# Patient Record
Sex: Female | Born: 1955 | Race: White | Hispanic: No | Marital: Married | Smoking: Never smoker
Health system: Southern US, Community
[De-identification: ages and names within clinical notes are randomized; demographics above are authoritative.]

## PROBLEM LIST (undated history)

## (undated) DIAGNOSIS — I499 Cardiac arrhythmia, unspecified: Secondary | ICD-10-CM

## (undated) DIAGNOSIS — M1990 Unspecified osteoarthritis, unspecified site: Secondary | ICD-10-CM

## (undated) DIAGNOSIS — E7800 Pure hypercholesterolemia, unspecified: Secondary | ICD-10-CM

## (undated) DIAGNOSIS — I10 Essential (primary) hypertension: Secondary | ICD-10-CM

## (undated) DIAGNOSIS — T8859XA Other complications of anesthesia, initial encounter: Secondary | ICD-10-CM

## (undated) DIAGNOSIS — Z8489 Family history of other specified conditions: Secondary | ICD-10-CM

## (undated) DIAGNOSIS — Z9889 Other specified postprocedural states: Secondary | ICD-10-CM

## (undated) DIAGNOSIS — K257 Chronic gastric ulcer without hemorrhage or perforation: Secondary | ICD-10-CM

## (undated) DIAGNOSIS — F419 Anxiety disorder, unspecified: Secondary | ICD-10-CM

## (undated) DIAGNOSIS — R112 Nausea with vomiting, unspecified: Secondary | ICD-10-CM

## (undated) DIAGNOSIS — E538 Deficiency of other specified B group vitamins: Secondary | ICD-10-CM

## (undated) DIAGNOSIS — Z8719 Personal history of other diseases of the digestive system: Secondary | ICD-10-CM

## (undated) DIAGNOSIS — K219 Gastro-esophageal reflux disease without esophagitis: Secondary | ICD-10-CM

## (undated) DIAGNOSIS — C44612 Basal cell carcinoma of skin of right upper limb, including shoulder: Secondary | ICD-10-CM

## (undated) DIAGNOSIS — C4442 Squamous cell carcinoma of skin of scalp and neck: Secondary | ICD-10-CM

## (undated) DIAGNOSIS — Z9109 Other allergy status, other than to drugs and biological substances: Secondary | ICD-10-CM

## (undated) DIAGNOSIS — T4145XA Adverse effect of unspecified anesthetic, initial encounter: Secondary | ICD-10-CM

## (undated) HISTORY — PX: JOINT REPLACEMENT: SHX530

## (undated) HISTORY — PX: COLONOSCOPY: SHX174

---

## 2002-04-22 HISTORY — PX: ABDOMINAL HYSTERECTOMY: SHX81

## 2010-04-22 HISTORY — PX: PLANTAR FASCIA RELEASE: SHX2239

## 2012-04-22 HISTORY — PX: CARPAL TUNNEL RELEASE: SHX101

## 2013-04-22 HISTORY — PX: MOHS SURGERY: SUR867

## 2015-03-23 HISTORY — PX: SHOULDER ARTHROSCOPY W/ ROTATOR CUFF REPAIR: SHX2400

## 2015-10-26 ENCOUNTER — Other Ambulatory Visit: Payer: Self-pay | Admitting: Orthopaedic Surgery

## 2015-10-26 ENCOUNTER — Other Ambulatory Visit (HOSPITAL_COMMUNITY): Payer: Self-pay | Admitting: *Deleted

## 2015-10-26 NOTE — Pre-Procedure Instructions (Signed)
Susan Delgado  10/26/2015    Your procedure is scheduled on Thursday, November 02, 2015 at 2:55 PM.   Report to [facility] Entrance "A" Admitting Office at 1:00 PM.   Call this number if you have problems the morning of surgery: [phone]   Any questions prior to day of surgery, please call [phone] between 8 & 4 PM.  Remember:  Do not eat food or drink liquids after midnight Wednesday, 10/31/15.   Take these medicines the morning of surgery with A SIP OF WATER: Dexlansoprazole (Dexilant), Flonase nasal spray, Tylenol - if needed.  Stop NSAIDS (Ibuprofen, Naproxen, Aleve, etc.) and Multivitamins as of today.    Do not wear jewelry, make-up or nail polish.  Do not wear lotions, powders, or perfumes.  You may wear deoderant.  Do not shave 48 hours prior to surgery.    Do not bring valuables to the hospital.  [facility] is not responsible for any belongings or valuables.  Contacts, dentures or bridgework may not be worn into surgery.  Leave your suitcase in the car.  After surgery it may be brought to your room.  For patients admitted to the hospital, discharge time will be determined by your treatment team.  Special instructions:  [name] - Preparing for Surgery  Before surgery, you can play an important role.  Because skin is not sterile, your skin needs to be as free of germs as possible.  You can reduce the number of germs on you skin by washing with CHG (chlorahexidine gluconate) soap before surgery.  CHG is an antiseptic cleaner which kills germs and bonds with the skin to continue killing germs even after washing.  Please DO NOT use if you have an allergy to CHG or antibacterial soaps.  If your skin becomes reddened/irritated stop using the CHG and inform your nurse when you arrive at Short Stay.  Do not shave (including legs and underarms) for at least 48 hours prior to the first CHG shower.  You may shave your face.  Please follow these instructions  carefully:   1.  Shower with CHG Soap the night before surgery and the                                morning of Surgery.  2.  If you choose to wash your hair, wash your hair first as usual with your       normal shampoo.  3.  After you shampoo, rinse your hair and body thoroughly to remove the                      Shampoo.  4.  Use CHG as you would any other liquid soap.  You can apply chg directly       to the skin and wash gently with scrungie or a clean washcloth.  5.  Apply the CHG Soap to your body ONLY FROM THE NECK DOWN.        Do not use on open wounds or open sores.  Avoid contact with your eyes, ears, mouth and genitals (private parts).  Wash genitals (private parts) with your normal soap.  6.  Wash thoroughly, paying special attention to the area where your surgery        will be performed.  7.  Thoroughly rinse your body with warm water from the neck down.  8.  DO NOT shower/wash  with your normal soap after using and rinsing off       the CHG Soap.  9.  Pat yourself dry with a clean towel.            10.  Wear clean pajamas.            11.  Place clean sheets on your bed the night of your first shower and do not        sleep with pets.  Day of Surgery  Do not apply any lotions the morning of surgery.  Please wear clean clothes to the hospital.   Please read over the following fact sheets that you were given. Pain Booklet, Coughing and Deep Breathing, MRSA Information and Surgical Site Infection Prevention

## 2015-10-27 ENCOUNTER — Encounter (HOSPITAL_COMMUNITY): Payer: Self-pay

## 2015-10-27 ENCOUNTER — Encounter (HOSPITAL_COMMUNITY)
Admission: RE | Admit: 2015-10-27 | Discharge: 2015-10-27 | Disposition: A | Discharge status: Home / Self Care | Payer: BLUE CROSS/BLUE SHIELD | Source: Ambulatory Visit | Attending: Orthopaedic Surgery | Admitting: Orthopaedic Surgery

## 2015-10-27 ENCOUNTER — Other Ambulatory Visit: Payer: Self-pay

## 2015-10-27 DIAGNOSIS — Z0183 Encounter for blood typing: Secondary | ICD-10-CM | POA: Insufficient documentation

## 2015-10-27 DIAGNOSIS — Z01818 Encounter for other preprocedural examination: Secondary | ICD-10-CM | POA: Insufficient documentation

## 2015-10-27 DIAGNOSIS — M1712 Unilateral primary osteoarthritis, left knee: Secondary | ICD-10-CM | POA: Diagnosis not present

## 2015-10-27 DIAGNOSIS — Z01812 Encounter for preprocedural laboratory examination: Secondary | ICD-10-CM | POA: Insufficient documentation

## 2015-10-27 HISTORY — DX: Other complications of anesthesia, initial encounter: T88.59XA

## 2015-10-27 HISTORY — DX: Nausea with vomiting, unspecified: R11.2

## 2015-10-27 HISTORY — DX: Personal history of other diseases of the digestive system: Z87.19

## 2015-10-27 HISTORY — DX: Cardiac arrhythmia, unspecified: I49.9

## 2015-10-27 HISTORY — DX: Other allergy status, other than to drugs and biological substances: Z91.09

## 2015-10-27 HISTORY — DX: Pure hypercholesterolemia, unspecified: E78.00

## 2015-10-27 HISTORY — DX: Essential (primary) hypertension: I10

## 2015-10-27 HISTORY — DX: Other specified postprocedural states: Z98.890

## 2015-10-27 HISTORY — DX: Deficiency of other specified B group vitamins: E53.8

## 2015-10-27 HISTORY — DX: Unspecified osteoarthritis, unspecified site: M19.90

## 2015-10-27 HISTORY — DX: Family history of other specified conditions: Z84.89

## 2015-10-27 HISTORY — DX: Adverse effect of unspecified anesthetic, initial encounter: T41.45XA

## 2015-10-27 HISTORY — DX: Anxiety disorder, unspecified: F41.9

## 2015-10-27 LAB — CBC WITH DIFFERENTIAL/PLATELET
Basophils Absolute: 0 10*3/uL (ref 0.0–0.1)
Basophils Relative: 0 %
EOS PCT: 4 %
Eosinophils Absolute: 0.3 10*3/uL (ref 0.0–0.7)
HCT: 41.8 % (ref 36.0–46.0)
HEMOGLOBIN: 14.2 g/dL (ref 12.0–15.0)
LYMPHS ABS: 2.9 10*3/uL (ref 0.7–4.0)
LYMPHS PCT: 39 %
MCH: 29.2 pg (ref 26.0–34.0)
MCHC: 34 g/dL (ref 30.0–36.0)
MCV: 86 fL (ref 78.0–100.0)
MONOS PCT: 6 %
Monocytes Absolute: 0.4 10*3/uL (ref 0.1–1.0)
NEUTROS PCT: 51 %
Neutro Abs: 3.8 10*3/uL (ref 1.7–7.7)
Platelets: 228 10*3/uL (ref 150–400)
RBC: 4.86 MIL/uL (ref 3.87–5.11)
RDW: 13.1 % (ref 11.5–15.5)
WBC: 7.4 10*3/uL (ref 4.0–10.5)

## 2015-10-27 LAB — TYPE AND SCREEN
ABO/RH(D): O POS
Antibody Screen: NEGATIVE

## 2015-10-27 LAB — COMPREHENSIVE METABOLIC PANEL
ALK PHOS: 58 U/L (ref 38–126)
ALT: 18 U/L (ref 14–54)
AST: 20 U/L (ref 15–41)
Albumin: 3.5 g/dL (ref 3.5–5.0)
Anion gap: 5 (ref 5–15)
BUN: 18 mg/dL (ref 6–20)
CALCIUM: 9.3 mg/dL (ref 8.9–10.3)
CO2: 25 mmol/L (ref 22–32)
CREATININE: 1.08 mg/dL — AB (ref 0.44–1.00)
Chloride: 107 mmol/L (ref 101–111)
GFR, EST NON AFRICAN AMERICAN: 55 mL/min — AB (ref >60)
Glucose, Bld: 93 mg/dL (ref 65–99)
Potassium: 4.3 mmol/L (ref 3.5–5.1)
Sodium: 137 mmol/L (ref 135–145)
TOTAL PROTEIN: 6.6 g/dL (ref 6.5–8.1)
Total Bilirubin: 0.5 mg/dL (ref 0.3–1.2)

## 2015-10-27 LAB — URINALYSIS, ROUTINE W REFLEX MICROSCOPIC
BILIRUBIN URINE: NEGATIVE
GLUCOSE, UA: NEGATIVE mg/dL
Hgb urine dipstick: NEGATIVE
KETONES UR: NEGATIVE mg/dL
LEUKOCYTES UA: NEGATIVE
NITRITE: NEGATIVE
PROTEIN: NEGATIVE mg/dL
Specific Gravity, Urine: 1.01 (ref 1.005–1.030)
pH: 6 (ref 5.0–8.0)

## 2015-10-27 LAB — PROTIME-INR
INR: 0.96 (ref 0.00–1.49)
PROTHROMBIN TIME: 13 s (ref 11.6–15.2)

## 2015-10-27 LAB — C-REACTIVE PROTEIN: CRP: 1.3 mg/dL — ABNORMAL HIGH (ref <1.0)

## 2015-10-27 LAB — SURGICAL PCR SCREEN
MRSA, PCR: NEGATIVE
STAPHYLOCOCCUS AUREUS: NEGATIVE

## 2015-10-27 LAB — ABO/RH: ABO/RH(D): O POS

## 2015-10-27 LAB — SEDIMENTATION RATE: Sed Rate: 13 mm/hr (ref 0–22)

## 2015-10-27 LAB — APTT: aPTT: 31 seconds (ref 24–37)

## 2015-10-27 NOTE — Progress Notes (Signed)
Pt states she sees a cardiologist because of strong family history of heart disease. She states the only issue she has is PVC's. States they get worse with stress. She denies any chest pain or sob.  Cardiologist is Dr. Noel Gerold with [facility].

## 2015-10-31 ENCOUNTER — Other Ambulatory Visit: Payer: Self-pay | Admitting: Orthopaedic Surgery

## 2015-11-01 MED ORDER — TRANEXAMIC ACID 1000 MG/10ML IV SOLN
1000.0000 mg | INTRAVENOUS | Status: AC
  Administered 2015-11-02: 1000 mg via INTRAVENOUS
  Filled 2015-11-01: qty 10

## 2015-11-01 MED ORDER — CEFAZOLIN SODIUM-DEXTROSE 2-4 GM/100ML-% IV SOLN
2.0000 g | INTRAVENOUS | Status: AC
  Administered 2015-11-02: 2 g via INTRAVENOUS
  Filled 2015-11-01: qty 100

## 2015-11-01 MED ORDER — BUPIVACAINE LIPOSOME 1.3 % IJ SUSP
20.0000 mL | INTRAMUSCULAR | Status: AC
  Administered 2015-11-02: 20 mL
  Filled 2015-11-01: qty 20

## 2015-11-02 ENCOUNTER — Encounter (HOSPITAL_COMMUNITY)
Admission: RE | Disposition: A | Discharge status: Home / Self Care | Payer: Self-pay | Source: Ambulatory Visit | Attending: Orthopaedic Surgery

## 2015-11-02 ENCOUNTER — Encounter (HOSPITAL_COMMUNITY): Payer: Self-pay | Admitting: *Deleted

## 2015-11-02 ENCOUNTER — Inpatient Hospital Stay (HOSPITAL_COMMUNITY): Payer: BLUE CROSS/BLUE SHIELD | Admitting: Anesthesiology

## 2015-11-02 ENCOUNTER — Inpatient Hospital Stay (HOSPITAL_COMMUNITY)
Admission: RE | Admit: 2015-11-02 | Discharge: 2015-11-04 | DRG: 470 | Disposition: A | Discharge status: Home / Self Care | Payer: BLUE CROSS/BLUE SHIELD | Source: Ambulatory Visit | Attending: Orthopaedic Surgery | Admitting: Orthopaedic Surgery

## 2015-11-02 ENCOUNTER — Inpatient Hospital Stay (HOSPITAL_COMMUNITY): Payer: BLUE CROSS/BLUE SHIELD

## 2015-11-02 DIAGNOSIS — Z85828 Personal history of other malignant neoplasm of skin: Secondary | ICD-10-CM

## 2015-11-02 DIAGNOSIS — M25521 Pain in right elbow: Secondary | ICD-10-CM | POA: Diagnosis present

## 2015-11-02 DIAGNOSIS — I10 Essential (primary) hypertension: Secondary | ICD-10-CM | POA: Diagnosis present

## 2015-11-02 DIAGNOSIS — E538 Deficiency of other specified B group vitamins: Secondary | ICD-10-CM | POA: Diagnosis present

## 2015-11-02 DIAGNOSIS — Z7951 Long term (current) use of inhaled steroids: Secondary | ICD-10-CM | POA: Diagnosis not present

## 2015-11-02 DIAGNOSIS — D62 Acute posthemorrhagic anemia: Secondary | ICD-10-CM | POA: Diagnosis not present

## 2015-11-02 DIAGNOSIS — M7711 Lateral epicondylitis, right elbow: Secondary | ICD-10-CM | POA: Diagnosis present

## 2015-11-02 DIAGNOSIS — Z96659 Presence of unspecified artificial knee joint: Secondary | ICD-10-CM

## 2015-11-02 DIAGNOSIS — M25562 Pain in left knee: Secondary | ICD-10-CM | POA: Diagnosis present

## 2015-11-02 DIAGNOSIS — M1712 Unilateral primary osteoarthritis, left knee: Secondary | ICD-10-CM | POA: Diagnosis present

## 2015-11-02 DIAGNOSIS — E7800 Pure hypercholesterolemia, unspecified: Secondary | ICD-10-CM | POA: Diagnosis present

## 2015-11-02 HISTORY — DX: Squamous cell carcinoma of skin of scalp and neck: C44.42

## 2015-11-02 HISTORY — DX: Chronic gastric ulcer without hemorrhage or perforation: K25.7

## 2015-11-02 HISTORY — DX: Basal cell carcinoma of skin of right upper limb, including shoulder: C44.612

## 2015-11-02 HISTORY — PX: STERIOD INJECTION: SHX5046

## 2015-11-02 HISTORY — DX: Gastro-esophageal reflux disease without esophagitis: K21.9

## 2015-11-02 HISTORY — PX: TOTAL KNEE ARTHROPLASTY: SHX125

## 2015-11-02 SURGERY — ARTHROPLASTY, KNEE, TOTAL
Anesthesia: Spinal | Site: Knee | Laterality: Right

## 2015-11-02 MED ORDER — CHLORHEXIDINE GLUCONATE 4 % EX LIQD: 60.0000 mL | Freq: Once | CUTANEOUS | Status: DC

## 2015-11-02 MED ORDER — POLYETHYLENE GLYCOL 3350 17 G PO PACK: 17.0000 g | PACK | Freq: Every day | ORAL | Status: DC | PRN

## 2015-11-02 MED ORDER — TRANEXAMIC ACID 1000 MG/10ML IV SOLN
1000.0000 mg | Freq: Once | INTRAVENOUS | Status: AC
  Administered 2015-11-02: 1000 mg via INTRAVENOUS
  Filled 2015-11-02: qty 10

## 2015-11-02 MED ORDER — KETOROLAC TROMETHAMINE 30 MG/ML IJ SOLN
30.0000 mg | Freq: Four times a day (QID) | INTRAMUSCULAR | Status: DC | PRN
  Administered 2015-11-02 – 2015-11-04 (×5): 30 mg via INTRAVENOUS
  Filled 2015-11-02 (×5): qty 1

## 2015-11-02 MED ORDER — SODIUM CHLORIDE 0.9 % IR SOLN
Status: DC | PRN
  Administered 2015-11-02: 1000 mL

## 2015-11-02 MED ORDER — METOCLOPRAMIDE HCL 5 MG/ML IJ SOLN
5.0000 mg | Freq: Three times a day (TID) | INTRAMUSCULAR | Status: DC | PRN
  Administered 2015-11-02: 10 mg via INTRAVENOUS
  Filled 2015-11-02 (×2): qty 2

## 2015-11-02 MED ORDER — BUPIVACAINE IN DEXTROSE 0.75-8.25 % IT SOLN
INTRATHECAL | Status: DC | PRN
  Administered 2015-11-02: 2 mL via INTRATHECAL

## 2015-11-02 MED ORDER — BUPIVACAINE HCL (PF) 0.5 % IJ SOLN
INTRAMUSCULAR | Status: DC | PRN
  Administered 2015-11-02: 1 mL

## 2015-11-02 MED ORDER — MEPERIDINE HCL 25 MG/ML IJ SOLN: 6.2500 mg | INTRAMUSCULAR | Status: DC | PRN

## 2015-11-02 MED ORDER — PROMETHAZINE HCL 25 MG/ML IJ SOLN: 6.2500 mg | INTRAMUSCULAR | Status: DC | PRN

## 2015-11-02 MED ORDER — OXYCODONE HCL 5 MG PO TABS: 5.0000 mg | ORAL_TABLET | ORAL | Status: AC | PRN

## 2015-11-02 MED ORDER — PHENOL 1.4 % MT LIQD: 1.0000 | OROMUCOSAL | Status: DC | PRN

## 2015-11-02 MED ORDER — FLUTICASONE PROPIONATE 50 MCG/ACT NA SUSP
2.0000 | Freq: Every day | NASAL | Status: DC
  Administered 2015-11-03 – 2015-11-04 (×2): 2 via NASAL
  Filled 2015-11-02: qty 16

## 2015-11-02 MED ORDER — CEFAZOLIN SODIUM-DEXTROSE 2-4 GM/100ML-% IV SOLN
2.0000 g | Freq: Four times a day (QID) | INTRAVENOUS | Status: AC
  Administered 2015-11-02 – 2015-11-03 (×2): 2 g via INTRAVENOUS
  Filled 2015-11-02 (×2): qty 100

## 2015-11-02 MED ORDER — DEXTROSE 5 % IV SOLN
500.0000 mg | Freq: Four times a day (QID) | INTRAVENOUS | Status: DC | PRN
  Filled 2015-11-02: qty 5

## 2015-11-02 MED ORDER — SUCCINYLCHOLINE CHLORIDE 200 MG/10ML IV SOSY
PREFILLED_SYRINGE | INTRAVENOUS | Status: AC
  Filled 2015-11-02: qty 10

## 2015-11-02 MED ORDER — ALUM & MAG HYDROXIDE-SIMETH 200-200-20 MG/5ML PO SUSP: 30.0000 mL | ORAL | Status: DC | PRN

## 2015-11-02 MED ORDER — PANTOPRAZOLE SODIUM 40 MG PO TBEC
40.0000 mg | DELAYED_RELEASE_TABLET | Freq: Every day | ORAL | Status: DC
  Administered 2015-11-03 – 2015-11-04 (×2): 40 mg via ORAL
  Filled 2015-11-02 (×2): qty 1

## 2015-11-02 MED ORDER — MENTHOL 3 MG MT LOZG: 1.0000 | LOZENGE | OROMUCOSAL | Status: DC | PRN

## 2015-11-02 MED ORDER — MONTELUKAST SODIUM 10 MG PO TABS
10.0000 mg | ORAL_TABLET | Freq: Every day | ORAL | Status: DC
  Administered 2015-11-02 – 2015-11-03 (×2): 10 mg via ORAL
  Filled 2015-11-02 (×2): qty 1

## 2015-11-02 MED ORDER — SODIUM CHLORIDE 0.9 % IV SOLN: INTRAVENOUS | Status: DC

## 2015-11-02 MED ORDER — TRANEXAMIC ACID 1000 MG/10ML IV SOLN
2000.0000 mg | INTRAVENOUS | Status: DC
  Filled 2015-11-02: qty 20

## 2015-11-02 MED ORDER — BUPIVACAINE HCL (PF) 0.5 % IJ SOLN
INTRAMUSCULAR | Status: AC
  Filled 2015-11-02: qty 10

## 2015-11-02 MED ORDER — MORPHINE SULFATE (PF) 2 MG/ML IV SOLN
1.0000 mg | INTRAVENOUS | Status: DC | PRN
  Administered 2015-11-02 – 2015-11-03 (×2): 1 mg via INTRAVENOUS
  Filled 2015-11-02 (×2): qty 1

## 2015-11-02 MED ORDER — FENTANYL CITRATE (PF) 100 MCG/2ML IJ SOLN
100.0000 ug | Freq: Once | INTRAMUSCULAR | Status: AC
  Administered 2015-11-02: 100 ug via INTRAVENOUS

## 2015-11-02 MED ORDER — BUPIVACAINE LIPOSOME 1.3 % IJ SUSP
20.0000 mL | Freq: Once | INTRAMUSCULAR | Status: DC
  Filled 2015-11-02: qty 20

## 2015-11-02 MED ORDER — PROPOFOL 500 MG/50ML IV EMUL
INTRAVENOUS | Status: DC | PRN
  Administered 2015-11-02: 50 ug/kg/min via INTRAVENOUS

## 2015-11-02 MED ORDER — OXYCODONE HCL ER 10 MG PO T12A: 10.0000 mg | EXTENDED_RELEASE_TABLET | Freq: Two times a day (BID) | ORAL | Status: AC

## 2015-11-02 MED ORDER — FENTANYL CITRATE (PF) 250 MCG/5ML IJ SOLN
INTRAMUSCULAR | Status: AC
  Filled 2015-11-02: qty 5

## 2015-11-02 MED ORDER — LIDOCAINE HCL 1 % IJ SOLN
INTRAMUSCULAR | Status: DC | PRN
  Administered 2015-11-02: 1 mL

## 2015-11-02 MED ORDER — MAGNESIUM CITRATE PO SOLN: 1.0000 | Freq: Once | ORAL | Status: DC | PRN

## 2015-11-02 MED ORDER — METOCLOPRAMIDE HCL 5 MG PO TABS: 5.0000 mg | ORAL_TABLET | Freq: Three times a day (TID) | ORAL | Status: DC | PRN

## 2015-11-02 MED ORDER — OXYCODONE HCL 5 MG PO TABS
5.0000 mg | ORAL_TABLET | ORAL | Status: DC | PRN
  Administered 2015-11-02 – 2015-11-04 (×6): 15 mg via ORAL
  Filled 2015-11-02 (×4): qty 3
  Filled 2015-11-02: qty 1
  Filled 2015-11-02: qty 3
  Filled 2015-11-02: qty 2

## 2015-11-02 MED ORDER — TRIAMCINOLONE ACETONIDE 40 MG/ML IJ SUSP
INTRAMUSCULAR | Status: DC | PRN
  Administered 2015-11-02: 1 mL

## 2015-11-02 MED ORDER — BUPIVACAINE-EPINEPHRINE (PF) 0.5% -1:200000 IJ SOLN
INTRAMUSCULAR | Status: DC | PRN
  Administered 2015-11-02: 30 mL via PERINEURAL

## 2015-11-02 MED ORDER — DIPHENHYDRAMINE HCL 12.5 MG/5ML PO ELIX
25.0000 mg | ORAL_SOLUTION | ORAL | Status: DC | PRN
  Administered 2015-11-03 – 2015-11-04 (×2): 25 mg via ORAL
  Filled 2015-11-02: qty 10

## 2015-11-02 MED ORDER — NALOXEGOL OXALATE 12.5 MG PO TABS: 12.5000 mg | ORAL_TABLET | Freq: Every day | ORAL | Status: AC

## 2015-11-02 MED ORDER — POVIDONE-IODINE 10 % EX SOLN
CUTANEOUS | Status: DC | PRN
  Administered 2015-11-02: 1 via TOPICAL

## 2015-11-02 MED ORDER — METHOCARBAMOL 500 MG PO TABS
500.0000 mg | ORAL_TABLET | Freq: Four times a day (QID) | ORAL | Status: DC | PRN
  Administered 2015-11-02 – 2015-11-03 (×2): 500 mg via ORAL
  Filled 2015-11-02 (×2): qty 1

## 2015-11-02 MED ORDER — NALOXEGOL OXALATE 12.5 MG PO TABS
12.5000 mg | ORAL_TABLET | Freq: Every day | ORAL | Status: DC
  Administered 2015-11-02 – 2015-11-04 (×4): 12.5 mg via ORAL
  Filled 2015-11-02 (×4): qty 1

## 2015-11-02 MED ORDER — OXYCODONE HCL ER 10 MG PO T12A
10.0000 mg | EXTENDED_RELEASE_TABLET | Freq: Two times a day (BID) | ORAL | Status: DC
  Administered 2015-11-02 – 2015-11-04 (×4): 10 mg via ORAL
  Filled 2015-11-02 (×4): qty 1

## 2015-11-02 MED ORDER — RIVAROXABAN 10 MG PO TABS
10.0000 mg | ORAL_TABLET | Freq: Every day | ORAL | Status: DC
  Administered 2015-11-03 – 2015-11-04 (×2): 10 mg via ORAL
  Filled 2015-11-02 (×2): qty 1

## 2015-11-02 MED ORDER — MIDAZOLAM HCL 2 MG/2ML IJ SOLN
INTRAMUSCULAR | Status: AC
  Filled 2015-11-02: qty 2

## 2015-11-02 MED ORDER — SODIUM CHLORIDE 0.9 % IR SOLN
Status: DC | PRN
  Administered 2015-11-02: 3000 mL

## 2015-11-02 MED ORDER — RIVAROXABAN 10 MG PO TABS: 10.0000 mg | ORAL_TABLET | Freq: Every day | ORAL | Status: AC

## 2015-11-02 MED ORDER — ACETAMINOPHEN 650 MG RE SUPP: 650.0000 mg | Freq: Four times a day (QID) | RECTAL | Status: DC | PRN

## 2015-11-02 MED ORDER — HYDROMORPHONE HCL 1 MG/ML IJ SOLN: 0.2500 mg | INTRAMUSCULAR | Status: DC | PRN

## 2015-11-02 MED ORDER — ONDANSETRON HCL 4 MG/2ML IJ SOLN
INTRAMUSCULAR | Status: AC
  Filled 2015-11-02: qty 2

## 2015-11-02 MED ORDER — PROPOFOL 10 MG/ML IV BOLUS
INTRAVENOUS | Status: AC
  Filled 2015-11-02: qty 20

## 2015-11-02 MED ORDER — ONDANSETRON HCL 4 MG PO TABS: 4.0000 mg | ORAL_TABLET | Freq: Three times a day (TID) | ORAL | Status: AC | PRN

## 2015-11-02 MED ORDER — ACETAMINOPHEN 500 MG PO TABS
1000.0000 mg | ORAL_TABLET | Freq: Four times a day (QID) | ORAL | Status: AC
  Administered 2015-11-02 – 2015-11-03 (×3): 1000 mg via ORAL
  Filled 2015-11-02 (×4): qty 2

## 2015-11-02 MED ORDER — ONDANSETRON HCL 4 MG/2ML IJ SOLN
4.0000 mg | Freq: Four times a day (QID) | INTRAMUSCULAR | Status: DC | PRN
  Administered 2015-11-02: 4 mg via INTRAVENOUS
  Filled 2015-11-02: qty 2

## 2015-11-02 MED ORDER — SORBITOL 70 % SOLN: 30.0000 mL | Freq: Every day | Status: DC | PRN

## 2015-11-02 MED ORDER — MIDAZOLAM HCL 5 MG/5ML IJ SOLN
INTRAMUSCULAR | Status: DC | PRN
  Administered 2015-11-02: 1 mg via INTRAVENOUS

## 2015-11-02 MED ORDER — DOCUSATE SODIUM 100 MG PO CAPS
100.0000 mg | ORAL_CAPSULE | Freq: Two times a day (BID) | ORAL | Status: DC
  Administered 2015-11-02 – 2015-11-04 (×4): 100 mg via ORAL
  Filled 2015-11-02 (×4): qty 1

## 2015-11-02 MED ORDER — FENTANYL CITRATE (PF) 100 MCG/2ML IJ SOLN
INTRAMUSCULAR | Status: AC
  Administered 2015-11-02: 100 ug via INTRAVENOUS
  Filled 2015-11-02: qty 2

## 2015-11-02 MED ORDER — TRIAMCINOLONE ACETONIDE 40 MG/ML IJ SUSP
INTRAMUSCULAR | Status: AC
  Filled 2015-11-02: qty 5

## 2015-11-02 MED ORDER — 0.9 % SODIUM CHLORIDE (POUR BTL) OPTIME
TOPICAL | Status: DC | PRN
  Administered 2015-11-02: 1000 mL

## 2015-11-02 MED ORDER — LORATADINE 10 MG PO TABS
10.0000 mg | ORAL_TABLET | Freq: Every day | ORAL | Status: DC
  Administered 2015-11-02 – 2015-11-04 (×3): 10 mg via ORAL
  Filled 2015-11-02 (×3): qty 1

## 2015-11-02 MED ORDER — TRANEXAMIC ACID 1000 MG/10ML IV SOLN
2000.0000 mg | INTRAVENOUS | Status: DC | PRN
  Administered 2015-11-02: 2000 mg via TOPICAL

## 2015-11-02 MED ORDER — LIDOCAINE HCL (PF) 1 % IJ SOLN
INTRAMUSCULAR | Status: AC
  Filled 2015-11-02: qty 30

## 2015-11-02 MED ORDER — LACTATED RINGERS IV SOLN
INTRAVENOUS | Status: DC
  Administered 2015-11-02 (×2): via INTRAVENOUS

## 2015-11-02 MED ORDER — SALINE FLUSH 0.9 % IV SOLN
INTRAVENOUS | Status: DC | PRN
  Administered 2015-11-02: 40 mL

## 2015-11-02 MED ORDER — LORAZEPAM 0.5 MG PO TABS: 0.5000 mg | ORAL_TABLET | ORAL | Status: DC | PRN

## 2015-11-02 MED ORDER — SUCRALFATE 1 GM/10ML PO SUSP
1.0000 g | Freq: Three times a day (TID) | ORAL | Status: DC
  Administered 2015-11-02 – 2015-11-04 (×6): 1 g via ORAL
  Filled 2015-11-02 (×6): qty 10

## 2015-11-02 MED ORDER — METHOCARBAMOL 750 MG PO TABS: 750.0000 mg | ORAL_TABLET | Freq: Two times a day (BID) | ORAL | Status: AC | PRN

## 2015-11-02 MED ORDER — LOSARTAN POTASSIUM 50 MG PO TABS
100.0000 mg | ORAL_TABLET | Freq: Every day | ORAL | Status: DC
  Administered 2015-11-02 – 2015-11-04 (×3): 100 mg via ORAL
  Filled 2015-11-02 (×3): qty 2

## 2015-11-02 MED ORDER — DEXAMETHASONE SODIUM PHOSPHATE 10 MG/ML IJ SOLN
10.0000 mg | Freq: Once | INTRAMUSCULAR | Status: AC
  Administered 2015-11-03: 10 mg via INTRAVENOUS
  Filled 2015-11-02: qty 1

## 2015-11-02 MED ORDER — ACETAMINOPHEN 325 MG PO TABS
650.0000 mg | ORAL_TABLET | Freq: Four times a day (QID) | ORAL | Status: DC | PRN
  Administered 2015-11-03: 650 mg via ORAL
  Filled 2015-11-02: qty 2

## 2015-11-02 MED ORDER — ONDANSETRON HCL 4 MG PO TABS: 4.0000 mg | ORAL_TABLET | Freq: Four times a day (QID) | ORAL | Status: DC | PRN

## 2015-11-02 MED ORDER — ONDANSETRON HCL 4 MG/2ML IJ SOLN
INTRAMUSCULAR | Status: DC | PRN
  Administered 2015-11-02: 4 mg via INTRAVENOUS

## 2015-11-02 SURGICAL SUPPLY — 67 items
ALCOHOL ISOPROPYL (RUBBING) (MISCELLANEOUS) ×3 IMPLANT
BAG DECANTER FOR FLEXI CONT (MISCELLANEOUS) ×3 IMPLANT
BANDAGE ELASTIC 6 VELCRO ST LF (GAUZE/BANDAGES/DRESSINGS) IMPLANT
BANDAGE ESMARK 6X9 LF (GAUZE/BANDAGES/DRESSINGS) ×2 IMPLANT
BENZOIN TINCTURE PRP APPL 2/3 (GAUZE/BANDAGES/DRESSINGS) IMPLANT
BLADE SAW SGTL 13.0X1.19X90.0M (BLADE) ×3 IMPLANT
BNDG ELASTIC 6X10 VLCR STRL LF (GAUZE/BANDAGES/DRESSINGS) ×3 IMPLANT
BNDG ESMARK 6X9 LF (GAUZE/BANDAGES/DRESSINGS) ×3
BONE CEMENT PALACOS R-G (Orthopedic Implant) ×6 IMPLANT
BOWL SMART MIX CTS (DISPOSABLE) ×3 IMPLANT
CAP KNEE TOTAL 3 ×3 IMPLANT
CEMENT BONE PALACOS R-G (Orthopedic Implant) ×4 IMPLANT
CLSR STERI-STRIP ANTIMIC 1/2X4 (GAUZE/BANDAGES/DRESSINGS) ×3 IMPLANT
COVER SURGICAL LIGHT HANDLE (MISCELLANEOUS) ×3 IMPLANT
CUFF TOURNIQUET SINGLE 34IN LL (TOURNIQUET CUFF) ×3 IMPLANT
CUFF TOURNIQUET SINGLE 44IN (TOURNIQUET CUFF) IMPLANT
DRAPE EXTREMITY T 121X128X90 (DRAPE) ×3 IMPLANT
DRAPE INCISE IOBAN 66X45 STRL (DRAPES) IMPLANT
DRAPE ORTHO SPLIT 77X108 STRL (DRAPES) ×2
DRAPE PROXIMA HALF (DRAPES) ×3 IMPLANT
DRAPE SURG 17X11 SM STRL (DRAPES) ×6 IMPLANT
DRAPE SURG ORHT 6 SPLT 77X108 (DRAPES) ×4 IMPLANT
DRSG AQUACEL AG ADV 3.5X14 (GAUZE/BANDAGES/DRESSINGS) ×3 IMPLANT
DURAPREP 26ML APPLICATOR (WOUND CARE) ×9 IMPLANT
ELECT CAUTERY BLADE 6.4 (BLADE) ×3 IMPLANT
ELECT REM PT RETURN 9FT ADLT (ELECTROSURGICAL) ×3
ELECTRODE REM PT RTRN 9FT ADLT (ELECTROSURGICAL) ×2 IMPLANT
GLOVE SURG SYN 7.5  E (GLOVE) ×2
GLOVE SURG SYN 7.5 E (GLOVE) ×4 IMPLANT
GOWN STRL REIN XL XLG (GOWN DISPOSABLE) ×3 IMPLANT
GOWN STRL REUS W/ TWL LRG LVL3 (GOWN DISPOSABLE) ×2 IMPLANT
GOWN STRL REUS W/TWL LRG LVL3 (GOWN DISPOSABLE) ×1
HANDPIECE INTERPULSE COAX TIP (DISPOSABLE) ×1
HOOD PEEL AWAY FLYTE STAYCOOL (MISCELLANEOUS) ×6 IMPLANT
KIT BASIN OR (CUSTOM PROCEDURE TRAY) ×6 IMPLANT
KIT ROOM TURNOVER OR (KITS) ×3 IMPLANT
MANIFOLD NEPTUNE II (INSTRUMENTS) ×3 IMPLANT
NEEDLE 18GX1X1/2 (RX/OR ONLY) (NEEDLE) ×6 IMPLANT
NEEDLE SPNL 18GX3.5 QUINCKE PK (NEEDLE) ×3 IMPLANT
NS IRRIG 1000ML POUR BTL (IV SOLUTION) ×3 IMPLANT
PACK TOTAL JOINT (CUSTOM PROCEDURE TRAY) ×3 IMPLANT
PAD ARMBOARD 7.5X6 YLW CONV (MISCELLANEOUS) ×6 IMPLANT
PEN SKIN MARKING BROAD (MISCELLANEOUS) ×3 IMPLANT
SAW OSC TIP CART 19.5X105X1.3 (SAW) ×3 IMPLANT
SEALER BIPOLAR AQUA 6.0 (INSTRUMENTS) ×3 IMPLANT
SET HNDPC FAN SPRY TIP SCT (DISPOSABLE) ×2 IMPLANT
STAPLER VISISTAT 35W (STAPLE) IMPLANT
SUCTION FRAZIER HANDLE 10FR (MISCELLANEOUS) ×1
SUCTION TUBE FRAZIER 10FR DISP (MISCELLANEOUS) ×2 IMPLANT
SUT ETHILON 2 0 FS 18 (SUTURE) IMPLANT
SUT MNCRL AB 3-0 PS2 18 (SUTURE) ×3 IMPLANT
SUT MNCRL AB 4-0 PS2 18 (SUTURE) IMPLANT
SUT VIC AB 0 CT1 27 (SUTURE) ×2
SUT VIC AB 0 CT1 27XBRD ANBCTR (SUTURE) ×4 IMPLANT
SUT VIC AB 0 CTX 36 (SUTURE)
SUT VIC AB 0 CTX36XBRD ANTBCTR (SUTURE) IMPLANT
SUT VIC AB 1 CTX 36 (SUTURE) ×3
SUT VIC AB 1 CTX36XBRD ANBCTR (SUTURE) ×6 IMPLANT
SUT VIC AB 2-0 CT1 27 (SUTURE) ×3
SUT VIC AB 2-0 CT1 TAPERPNT 27 (SUTURE) ×6 IMPLANT
SYR 20CC LL (SYRINGE) ×3 IMPLANT
SYR 50ML LL SCALE MARK (SYRINGE) ×3 IMPLANT
TOWEL OR 17X24 6PK STRL BLUE (TOWEL DISPOSABLE) ×3 IMPLANT
TOWEL OR 17X26 10 PK STRL BLUE (TOWEL DISPOSABLE) ×3 IMPLANT
TRAY FOLEY BAG SILVER LF 14FR (CATHETERS) ×3 IMPLANT
WRAP KNEE MAXI GEL POST OP (GAUZE/BANDAGES/DRESSINGS) ×3 IMPLANT
YANKAUER SUCT BULB TIP NO VENT (SUCTIONS) ×3 IMPLANT

## 2015-11-02 NOTE — Transfer of Care (Signed)
Immediate Anesthesia Transfer of Care Note  Patient: Susan Delgado  Procedure(s) Performed: Procedure(s): LEFT TOTAL KNEE ARTHROPLASTY RIGHT ELBOW INJECTION  (Left)  RIGHT ELBOW INJECTION  (Right)  Patient Location: PACU  Anesthesia Type:Spinal  Level of Consciousness: awake, alert  and oriented  Airway & Oxygen Therapy: Patient Spontanous Breathing  Post-op Assessment: Report given to RN  Post vital signs: Reviewed and stable  Last Vitals:  Filed Vitals:   11/02/15 1201 11/02/15 1206  BP: 168/87 185/85  Pulse: 74 71  Temp:    Resp: 13 15    Last Pain: There were no vitals filed for this visit.    Patients Stated Pain Goal: 4 ([id number])  Complications: No apparent anesthesia complications

## 2015-11-02 NOTE — Anesthesia Procedure Notes (Addendum)
Anesthesia Regional Block:  Adductor canal block  Pre-Anesthetic Checklist: ,, timeout performed, Correct Patient, Correct Site, Correct Laterality, Correct Procedure, Correct Position, site marked, Risks and benefits discussed, Surgical consent,  Pre-op evaluation,  Post-op pain management  Laterality: Left  Prep: chloraprep       Needles:  Injection technique: Single-shot  Needle Type: Stimiplex     Needle Length: 9cm 9 cm Needle Gauge: 21 and 21 G    Additional Needles:  Procedures: ultrasound guided (picture in chart) Adductor canal block Narrative:  Injection made incrementally with aspirations every 5 mL.  Performed by: Personally  Anesthesiologist: Nolon Nations  Additional Notes: BP cuff, EKG monitors applied. Sedation begun. Artery and nerve location verified with U/S and anesthetic injected incrementally, slowly, and after negative aspirations under direct u/s guidance. Good fascial /perineural spread. Tolerated well.   Procedure Name: MAC Date/Time: 11/02/2015 12:26 PM Performed by: Barrington Ellison Pre-anesthesia Checklist: Patient identified, Emergency Drugs available, Suction available, Patient being monitored and Timeout performed Patient Re-evaluated:Patient Re-evaluated prior to inductionOxygen Delivery Method: Simple face mask    Spinal Patient location during procedure: OR Staffing Anesthesiologist: Nolon Nations Performed by: anesthesiologist  Preanesthetic Checklist Completed: patient identified, site marked, surgical consent, pre-op evaluation, timeout performed, IV checked, risks and benefits discussed and monitors and equipment checked Spinal Block Patient position: sitting Prep: Betadine Patient monitoring: heart rate, continuous pulse ox and blood pressure Approach: right paramedian Location: L3-4 Injection technique: single-shot Needle Needle type: Sprotte  Needle gauge: 24 G Needle length: 9 cm Additional Notes Expiration date  of kit checked and confirmed. Patient tolerated procedure well, without complications.

## 2015-11-02 NOTE — Progress Notes (Signed)
Orthopedic Tech Progress Note Patient Details:  Susan Delgado 1955/07/29 [id number]  Ortho Devices Ortho Device/Splint Location: foot roll Ortho Device/Splint Interventions: Application   [name] 11/02/2015, 4:44 PM

## 2015-11-02 NOTE — Anesthesia Postprocedure Evaluation (Signed)
Anesthesia Post Note  Patient: Susan Delgado  Procedure(s) Performed: Procedure(s) (LRB): LEFT TOTAL KNEE ARTHROPLASTY RIGHT ELBOW INJECTION  (Left)  RIGHT ELBOW INJECTION  (Right)  Patient location during evaluation: PACU Anesthesia Type: Spinal and MAC Level of consciousness: awake and alert Pain management: pain level controlled Vital Signs Assessment: post-procedure vital signs reviewed and stable Respiratory status: spontaneous breathing and respiratory function stable Cardiovascular status: blood pressure returned to baseline and stable Postop Assessment: spinal receding Anesthetic complications: no    Last Vitals:  Filed Vitals:   11/02/15 1700 11/02/15 2104  BP: 144/80 140/74  Pulse: 66 67  Temp: 36.3 C 36.4 C  Resp: 17 16    Last Pain:  Filed Vitals:   11/02/15 2105  PainSc: [place]

## 2015-11-02 NOTE — Anesthesia Preprocedure Evaluation (Addendum)
Anesthesia Evaluation  Patient identified by MRN, date of birth, ID band Patient awake    Reviewed: Allergy & Precautions, NPO status , Patient's Chart, lab work & pertinent test results  History of Anesthesia Complications (+) PONV, Family history of anesthesia reaction and history of anesthetic complications  Airway Mallampati: II  TM Distance: >3 FB Neck ROM: Full    Dental no notable dental hx.    Pulmonary neg pulmonary ROS,    Pulmonary exam normal breath sounds clear to auscultation       Cardiovascular hypertension, Normal cardiovascular exam+ dysrhythmias  Rhythm:Regular Rate:Normal     Neuro/Psych PSYCHIATRIC DISORDERS Anxiety negative neurological ROS     GI/Hepatic Neg liver ROS, hiatal hernia,   Endo/Other  negative endocrine ROS  Renal/GU negative Renal ROS     Musculoskeletal  (+) Arthritis ,   Abdominal   Peds  Hematology negative hematology ROS (+)   Anesthesia Other Findings   Reproductive/Obstetrics negative OB ROS                            Anesthesia Physical Anesthesia Plan  ASA: II  Anesthesia Plan: Spinal   Post-op Pain Management:    Induction: Intravenous  Airway Management Planned:   Additional Equipment:   Intra-op Plan:   Post-operative Plan:   Informed Consent: I have reviewed the patients History and Physical, chart, labs and discussed the procedure including the risks, benefits and alternatives for the proposed anesthesia with the patient or authorized representative who has indicated his/her understanding and acceptance.   Dental advisory given  Plan Discussed with: CRNA  Anesthesia Plan Comments:        Anesthesia Quick Evaluation

## 2015-11-02 NOTE — H&P (Addendum)
PREOPERATIVE H&P  Chief Complaint: left knee degenerative joint disease RIGHT ELBOW PAIN   HPI: Susan Delgado is a 60 y.o. female who presents for surgical treatment of left knee degenerative joint disease RIGHT ELBOW PAIN .  She denies any changes in medical history.  Past Medical History  Diagnosis Date  . Dysrhythmia     PVC's  . High cholesterol   . Hypertension   . Environmental allergies   . Anxiety   . History of hiatal hernia   . Arthritis   . Cancer (HCC)     squamous cell cancer on scalp, basal cell on arm  . Vitamin B12 deficiency     on monthly injections  . Complication of anesthesia   . PONV (postoperative nausea and vomiting)     needs patch  . Family history of adverse reaction to anesthesia     daughter has difficulty waking up    Past Surgical History  Procedure Laterality Date  . Hand surgery Right   . Shoulder arthroscopy w/ rotator cuff repair    . Abdominal hysterectomy    . Colonoscopy     Social History   Social History  . Marital Status: Married    Spouse Name: N/A  . Number of Children: N/A  . Years of Education: N/A   Social History Main Topics  . Smoking status: Never Smoker   . Smokeless tobacco: Never Used  . Alcohol Use: No  . Drug Use: No  . Sexual Activity: Not on file   Other Topics Concern  . Not on file   Social History Narrative  . No narrative on file   Family History  Problem Relation Age of Onset  . Stroke Mother   . Arthritis Mother   . Heart disease Mother   . Lung cancer Father    Allergies  Allergen Reactions  . No Known Allergies    Prior to Admission medications   Medication Sig Start Date End Date Taking? Authorizing Provider  acetaminophen (TYLENOL) 650 MG CR tablet Take 650 mg by mouth 2 (two) times daily as needed for pain.   Yes Historical Provider, MD  cetirizine (ZYRTEC) 10 MG tablet Take 5 mg by mouth at bedtime.   Yes Historical Provider, MD  dexlansoprazole (DEXILANT) 60 MG capsule Take  60 mg by mouth every morning.   Yes Historical Provider, MD  estradiol (CLIMARA - DOSED IN MG/24 HR) 0.1 mg/24hr patch Place 0.1 mg onto the skin every Sunday.   Yes Historical Provider, MD  fluticasone (FLONASE) 50 MCG/ACT nasal spray Place 2 sprays into both nostrils daily.   Yes Historical Provider, MD  ibuprofen (ADVIL,MOTRIN) 200 MG tablet Take 400 mg by mouth 2 (two) times daily as needed for moderate pain.   Yes Historical Provider, MD  losartan (COZAAR) 100 MG tablet Take 100 mg by mouth daily.   Yes Historical Provider, MD  montelukast (SINGULAIR) 10 MG tablet Take 10 mg by mouth at bedtime. 10/25/15  Yes Historical Provider, MD  Multiple Vitamins-Minerals (ALIVE WOMENS 50+ PO) Take 1 tablet by mouth daily. Chew 1 gummy daily.   Yes Historical Provider, MD  naproxen sodium (ANAPROX) 220 MG tablet Take 220 mg by mouth 2 (two) times daily as needed (for pain).   Yes Historical Provider, MD     Positive ROS: All other systems have been reviewed and were otherwise negative with the exception of those mentioned in the HPI and as above.  Physical Exam: General: Alert, no  acute distress Cardiovascular: No pedal edema Respiratory: No cyanosis, no use of accessory musculature GI: abdomen soft Skin: No lesions in the area of chief complaint Neurologic: Sensation intact distally Psychiatric: Patient is competent for consent with normal mood and affect Lymphatic: no lymphedema  MUSCULOSKELETAL: exam stable  Right elbow exam shows TTP of lateral epicondyle, pain with resisted ECRB  Assessment: left knee degenerative joint disease RIGHT ELBOW PAIN   Plan: Plan for Procedure(s): LEFT TOTAL KNEE ARTHROPLASTY RIGHT ELBOW INJECTION   The risks benefits and alternatives were discussed with the patient including but not limited to the risks of nonoperative treatment, versus surgical intervention including infection, bleeding, nerve injury,  blood clots, cardiopulmonary complications, morbidity,  mortality, among others, and they were willing to proceed.   Marianna Payment, MD   11/02/2015 10:22 AM

## 2015-11-02 NOTE — Op Note (Addendum)
Total Knee Arthroplasty Procedure Note [place] [id number] 11/02/2015   Preoperative diagnosis: Left knee osteoarthritis; right lateral epicondylitis  Postoperative diagnosis:same  Operative procedure: Left total knee arthroplasty. CPT [phone]; Right elbow injection into extensor tensor mass, tendon injection.  Surgeon: N. Eduard Roux, MD  Assistants: April Green, RNFA  Anesthesia: Spinal, regional  Tourniquet time: less than 90 min  Implants used: Smith Nephew Femur: Journey II BCS 3 Tibia: Journey II 3 Patella: 29 mm 7.5 thick Polyethylene: 10 mm  Indication: Susan Delgado is a 60 y.o. year old female with a history of knee pain. Having failed conservative management, the patient elected to proceed with a total knee arthroplasty.  We have reviewed the risk and benefits of the surgery and they elected to proceed after voicing understanding.  Procedure:  After informed consent was obtained and understanding of the risk were voiced including but not limited to bleeding, infection, damage to surrounding structures including nerves and vessels, blood clots, leg length inequality and the failure to achieve desired results, the operative extremity was marked with verbal confirmation of the patient in the holding area.   The patient was then brought to the operating room and transported to the operating room table in the supine position.  A tourniquet was applied to the operative extremity around the upper thigh. The operative limb was then prepped and draped in the usual sterile fashion and preoperative antibiotics were administered.  A time out was performed prior to the start of surgery confirming the correct extremity, preoperative antibiotic administration, as well as team members, implants and instruments available for the case. Correct surgical site was also confirmed with preoperative radiographs. We first began with the right elbow injection.  Under sterile technique, I injected  1 cc each of 40 mg kenalog, 0.25% marcaine, and 1% lidocaine with 25 G needle into the right extensor mass tendon.  Bandaid was applied.  We then turned our attention to the left knee. The limb was then elevated for exsanguination and the tourniquet was inflated. A midline incision was made and a standard medial parapatellar approach was performed.  The patella was prepared and sized to a 29 mm.  A cover was placed on the patella for protection from retractors.  We then turned our attention to the femur. Posterior cruciate ligament was sacrificed. Start site was drilled in the femur and the intramedullary distal femoral cutting guide was placed, set at 5 degrees valgus, taking 9 mm of distal resection. The distal cut was made. Osteophytes were then removed. Next, the proximal tibial cutting guide was placed with appropriate slope, varus/valgus alignment and depth of resection. The proximal tibial cut was made. Gap blocks were then used to assess the extension gap and alignment, and appropriate soft tissue releases were performed. Attention was turned back to the femur, which was sized using the sizing guide to a size 3. Appropriate rotation of the femoral component was determined using epicondylar axis, Whiteside's line, and assessing the flexion gap under ligament tension. The appropriate size 4-in-1 cutting block was placed and cuts were made. Posterior femoral osteophytes and uncapped bone were then removed with the curved osteotome. The tibia was sized for a size 3 component. The femoral box-cutting guide was placed and prepared for a PS femoral component. Trial components were placed, and stability was checked in full extension, mid-flexion, and deep flexion. Proper tibial rotation was determined and marked.  The patella tracked well without a lateral release. Trial components were then removed and tibial  preparation performed. A posterior capsular injection comprising of 20 cc of 1.3% exparel and 40 cc of  normal saline was performed for postoperative pain control. The bony surfaces were irrigated with a pulse lavage and then dried. Bone cement was vacuum mixed on the back table, and the final components sized above were cemented into place. After cement had finished curing, excess cement was removed. The stability of the construct was re-evaluated throughout a range of motion and found to be acceptable. The trial liner was removed, the knee was copiously irrigated, and the knee was re-evaluated for any excess bone debris. The real polyethylene liner, 10 mm thick, was inserted and checked to ensure the locking mechanism had engaged appropriately. The tourniquet was deflated and hemostasis was achieved. The wound was irrigated with dilute betadine in normal saline, and then again with normal saline. A drain was not placed.  Capsular closure was performed with a #1 vicryl, subcutaneous fat closed with a 0 vicryl suture, then subcutaneous tissue closed with interrupted 2.0 vicryl suture. The skin was then closed with a 3.0 monocryl. A sterile dressing was applied.   The patient was awakened in the operating room and taken to recovery in stable condition. All sponge, needle, and instrument counts were correct at the end of the case.  Position: supine  Complications: none.  Time Out: performed   Drains/Packing: none  Estimated blood loss: 50 cc  Returned to Recovery Room: in good condition.   Antibiotics: yes   Mechanical VTE (DVT) Prophylaxis: sequential compression devices, TED thigh-high  Chemical VTE (DVT) Prophylaxis: xarelto  Fluid Replacement  Crystalloid: see anesthesia record Blood: none  FFP: none   Specimens Removed: 1 to pathology   Sponge and Instrument Count Correct? yes   PACU: portable radiograph - knee AP and Lateral   Admission: inpatient status, start PT & OT POD#1  Plan/RTC: Return in 2 weeks for wound check.   Weight Bearing/Load Lower Extremity: full   N. Eduard Roux, MD [place] 2:29 PM   \

## 2015-11-03 ENCOUNTER — Encounter (HOSPITAL_COMMUNITY): Payer: Self-pay | Admitting: Orthopaedic Surgery

## 2015-11-03 LAB — BASIC METABOLIC PANEL
ANION GAP: 7 (ref 5–15)
BUN: 15 mg/dL (ref 6–20)
CHLORIDE: 102 mmol/L (ref 101–111)
CO2: 24 mmol/L (ref 22–32)
Calcium: 8.6 mg/dL — ABNORMAL LOW (ref 8.9–10.3)
Creatinine, Ser: 1.06 mg/dL — ABNORMAL HIGH (ref 0.44–1.00)
GFR calc non Af Amer: 56 mL/min — ABNORMAL LOW (ref >60)
GLUCOSE: 142 mg/dL — AB (ref 65–99)
Potassium: 4.1 mmol/L (ref 3.5–5.1)
Sodium: 133 mmol/L — ABNORMAL LOW (ref 135–145)

## 2015-11-03 LAB — CBC
HEMATOCRIT: 37.2 % (ref 36.0–46.0)
HEMOGLOBIN: 12.8 g/dL (ref 12.0–15.0)
MCH: 29.2 pg (ref 26.0–34.0)
MCHC: 34.4 g/dL (ref 30.0–36.0)
MCV: 84.9 fL (ref 78.0–100.0)
Platelets: 203 10*3/uL (ref 150–400)
RBC: 4.38 MIL/uL (ref 3.87–5.11)
RDW: 13.1 % (ref 11.5–15.5)
WBC: 13.7 10*3/uL — AB (ref 4.0–10.5)

## 2015-11-03 MED FILL — Sodium Chloride Inj 0.9%: INTRAMUSCULAR | Qty: 20 | Status: AC

## 2015-11-03 NOTE — Discharge Summary (Signed)
Physician Discharge Summary      Patient ID: Susan Delgado MRN: [id number] DOB/AGE: [birth year] 60 y.o.  Admit date: 11/02/2015 Discharge date: 11/07/2015  Admission Diagnoses:  <principal problem not specified>  Discharge Diagnoses:  Active Problems:   Osteoarthritis of left knee   Total knee replacement status   Past Medical History  Diagnosis Date  . Dysrhythmia     PVC's  . High cholesterol   . Hypertension   . Environmental allergies   . Anxiety   . History of hiatal hernia   . Vitamin B12 deficiency     on monthly injections  . Complication of anesthesia   . PONV (postoperative nausea and vomiting)     needs patch  . Family history of adverse reaction to anesthesia     daughter has difficulty waking up   . GERD (gastroesophageal reflux disease)   . Chronic stomach ulcer   . Arthritis     "pretty much all over" (11/02/2015)  . Squamous cell carcinoma of scalp     "MOHS"  . Basal cell carcinoma of right shoulder     "burned off"    Surgeries: Procedure(s): LEFT TOTAL KNEE ARTHROPLASTY RIGHT ELBOW INJECTION   RIGHT ELBOW INJECTION  on 11/02/2015   Consultants (if any):    Discharged Condition: Improved  Hospital Course: Susan Delgado is an 60 y.o. female who was admitted 11/02/2015 with a diagnosis of <principal problem not specified> and went to the operating room on 11/02/2015 and underwent the above named procedures.    She was given perioperative antibiotics:      Anti-infectives    Start     Dose/Rate Route Frequency Ordered Stop   11/02/15 1830  ceFAZolin (ANCEF) IVPB 2g/100 mL premix     2 g 200 mL/hr over 30 Minutes Intravenous Every 6 hours 11/02/15 1600 11/03/15 0046   11/02/15 1200  ceFAZolin (ANCEF) IVPB 2g/100 mL premix     2 g 200 mL/hr over 30 Minutes Intravenous To [facility] 11/01/15 1407 11/02/15 1225    .  She was given sequential compression devices, early ambulation, and xarelto for DVT prophylaxis.  She benefited maximally  from the hospital stay and there were no complications.    Recent vital signs:  Filed Vitals:   11/03/15 1925 11/04/15 0551  BP: 149/71 133/60  Pulse: 81 69  Temp: 98.6 F (37 C) 98.5 F (36.9 C)  Resp: 20 18    Recent laboratory studies:  Lab Results  Component Value Date   HGB 12.8 11/03/2015   HGB 14.2 10/27/2015   Lab Results  Component Value Date   WBC 13.7* 11/03/2015   PLT 203 11/03/2015   Lab Results  Component Value Date   INR 0.96 10/27/2015   Lab Results  Component Value Date   NA 133* 11/03/2015   K 4.1 11/03/2015   CL 102 11/03/2015   CO2 24 11/03/2015   BUN 15 11/03/2015   CREATININE 1.06* 11/03/2015   GLUCOSE 142* 11/03/2015    Discharge Medications:     Medication List    TAKE these medications        acetaminophen 650 MG CR tablet  Commonly known as:  TYLENOL  Take 650 mg by mouth 2 (two) times daily as needed for pain.     ALIVE WOMENS 50+ PO  Take 1 tablet by mouth daily. Chew 1 gummy daily.     cetirizine 10 MG tablet  Commonly known as:  ZYRTEC  Take 5 mg by  mouth at bedtime.     dexlansoprazole 60 MG capsule  Commonly known as:  DEXILANT  Take 60 mg by mouth every morning.     estradiol 0.1 mg/24hr patch  Commonly known as:  CLIMARA - Dosed in mg/24 hr  Place 0.1 mg onto the skin every Sunday.     fluticasone 50 MCG/ACT nasal spray  Commonly known as:  FLONASE  Place 2 sprays into both nostrils daily.     ibuprofen 200 MG tablet  Commonly known as:  ADVIL,MOTRIN  Take 400 mg by mouth 2 (two) times daily as needed for moderate pain.     losartan 100 MG tablet  Commonly known as:  COZAAR  Take 100 mg by mouth daily.     methocarbamol 750 MG tablet  Commonly known as:  ROBAXIN  Take 1 tablet (750 mg total) by mouth 2 (two) times daily as needed for muscle spasms.     montelukast 10 MG tablet  Commonly known as:  SINGULAIR  Take 10 mg by mouth at bedtime.     naloxegol oxalate 12.5 MG Tabs tablet  Commonly known  as:  MOVANTIK  Take 1 tablet (12.5 mg total) by mouth daily.     naproxen sodium 220 MG tablet  Commonly known as:  ANAPROX  Take 220 mg by mouth 2 (two) times daily as needed (for pain).     ondansetron 4 MG tablet  Commonly known as:  ZOFRAN  Take 1-2 tablets (4-8 mg total) by mouth every 8 (eight) hours as needed for nausea or vomiting.     oxyCODONE 10 mg 12 hr tablet  Commonly known as:  OXYCONTIN  Take 1 tablet (10 mg total) by mouth every 12 (twelve) hours.     oxyCODONE 5 MG immediate release tablet  Commonly known as:  Oxy IR/ROXICODONE  Take 1-3 tablets (5-15 mg total) by mouth every 4 (four) hours as needed.     rivaroxaban 10 MG Tabs tablet  Commonly known as:  XARELTO  Take 1 tablet (10 mg total) by mouth daily.        Diagnostic Studies: Dg Knee Left Port  11/02/2015  CLINICAL DATA:  60 year old female status post total knee replacement. Initial encounter. EXAM: PORTABLE LEFT KNEE - 1-2 VIEW COMPARISON:  Left knee series 7317. FINDINGS: Portable cross-table lateral and AP views of the left knee at 1520 hours. Sequelae of left total knee arthroplasty. Hardware appears intact and normally aligned. Postoperative changes to the surrounding soft tissues including subcutaneous gas. Small ossific bodies in the popliteal space are stable and might be vascular or degenerative in nature. No unexpected osseous changes identified. IMPRESSION: Left total knee arthroplasty with no adverse features. Electronically Signed   By: Genevie Ann M.D.   On: 11/02/2015 15:39    Disposition: 01-Home or Self Care    Follow-up Information    Follow up with Marianna Payment, MD In 2 weeks.   Specialty:  Orthopedic Surgery   Why:  For suture removal, For wound re-check   Contact information:   [address] [place] [address] [phone]        Signed: Marianna Payment 11/07/2015, 9:18 PM

## 2015-11-03 NOTE — Evaluation (Signed)
Physical Therapy Evaluation Patient Details Name: Susan Delgado MRN: [id number] DOB: 1955/11/30 Today's Date: 11/03/2015   History of Present Illness  60 y.o. female now s/p Lt TKA and Rt elbow steroid injection. PMH: hypertension, anxiety, Lt rotator cuff repair 12/16.   Clinical Impression  Pt is s/p TKA resulting in the deficits listed below (see PT Problem List).  Pt able to ambulate 60 ft with rw and min guard assist during initial PT session. Pt will benefit from skilled PT to increase their independence and safety with mobility to allow discharge to home with spouse support.      Follow Up Recommendations Home health PT;Supervision for mobility/OOB    Equipment Recommendations  Rolling walker with 5" wheels;3in1 (PT)    Recommendations for Other Services       Precautions / Restrictions Precautions Precautions: Knee;Fall Precaution Booklet Issued: Yes (comment) Precaution Comments: HEP provided, reviewed knee extension precautions reviewed.  Restrictions Weight Bearing Restrictions: Yes LLE Weight Bearing: Weight bearing as tolerated      Mobility  Bed Mobility               General bed mobility comments: up in chair upon arrival  Transfers Overall transfer level: Needs assistance Equipment used: Rolling walker (2 wheeled) Transfers: Sit to/from Stand Sit to Stand: Min guard         General transfer comment: steady transfer, guard for safety  Ambulation/Gait Ambulation/Gait assistance: Min guard Ambulation Distance (Feet): 60 Feet Assistive device: Rolling walker (2 wheeled) Gait Pattern/deviations: Step-through pattern;Decreased weight shift to left;Decreased step length - right Gait velocity: decreased   General Gait Details: Working on heel-toe pattern and even stride length.   Stairs            Wheelchair Mobility    Modified Rankin (Stroke Patients Only)       Balance Overall balance assessment: Needs assistance Sitting-balance  support: No upper extremity supported Sitting balance-Leahy Scale: Good     Standing balance support: Bilateral upper extremity supported Standing balance-Leahy Scale: Poor Standing balance comment: using rw                             Pertinent Vitals/Pain Pain Assessment: 0-10 Pain Score: 2  Pain Location: Lt knee Pain Descriptors / Indicators: Throbbing;Burning Pain Intervention(s): Monitored during session;Limited activity within patient's tolerance    Home Living Family/patient expects to be discharged to:: Private residence Living Arrangements: Spouse/significant other Available Help at Discharge: Available 24 hours/day Type of Home: House Home Access: Stairs to enter Entrance Stairs-Rails: None Entrance Stairs-Number of Steps: 1 Home Layout: Two level;Able to live on main level with bedroom/bathroom Home Equipment: Kasandra Knudsen - single point;Walker - 4 wheels      Prior Function Level of Independence: Independent               Hand Dominance        Extremity/Trunk Assessment   Upper Extremity Assessment: RUE deficits/detail RUE Deficits / Details: received injection into Rt elbow during surgery (biceps region)     LUE Deficits / Details: 12/16 Rotator cuff repair.    Lower Extremity Assessment: LLE deficits/detail   LLE Deficits / Details: poor quad activation     Communication   Communication: No difficulties  Cognition Arousal/Alertness: Awake/alert Behavior During Therapy: WFL for tasks assessed/performed Overall Cognitive Status: Within Functional Limits for tasks assessed  General Comments      Exercises Total Joint Exercises Ankle Circles/Pumps: AROM;Both;10 reps Quad Sets: Strengthening;Left;10 reps      Assessment/Plan    PT Assessment Patient needs continued PT services  PT Diagnosis Difficulty walking   PT Problem List Decreased strength;Decreased range of motion;Decreased activity  tolerance;Decreased balance;Decreased mobility  PT Treatment Interventions DME instruction;Gait training;Stair training;Functional mobility training;Therapeutic activities;Therapeutic exercise;Patient/family education   PT Goals (Current goals can be found in the Care Plan section) Acute Rehab PT Goals Patient Stated Goal: get home and walking better again PT Goal Formulation: With patient Time For Goal Achievement: 11/17/15 Potential to Achieve Goals: Good    Frequency 7X/week   Barriers to discharge        Co-evaluation               End of Session Equipment Utilized During Treatment: Gait belt Activity Tolerance: Patient tolerated treatment well Patient left: in chair;with call bell/phone within reach;with family/visitor present (in knee extension) Nurse Communication: Mobility status;Weight bearing status         Time: [id number] PT Time Calculation (min) (ACUTE ONLY): 34 min   Charges:   PT Evaluation $PT Eval Moderate Complexity: 1 Procedure PT Treatments $Gait Training: 8-22 mins   PT G Codes:        Cassell Clement, PT, CSCS Pager [phone] Office [phone]  11/03/2015, 11:38 AM

## 2015-11-03 NOTE — Care Management Note (Signed)
Case Management Note  Patient Details  Name: Susan Delgado MRN: [id number] Date of Birth: 1955/06/11  Subjective/Objective:                    Action/Plan: Plan is for patient to discharge home tomorrow with DME and [facility] services. CM met with the patient and she already has a walker at home but needs the 3 in 1. James with [facility] DME notified and will deliver the equipment to the room. Pt already had asked to use [facility] through [facility] office. CM called Stanton Kidney with [facility]) and verified patient set up with them for services. Patient discharging on Xarelto CM will provide her the 30 day fee and $0 copay cards and will continue to follow for further d/c needs.   Expected Discharge Date:                  Expected Discharge Plan:  [place]  In-House Referral:     Discharge planning Services  CM Consult  Post Acute Care Choice:  Durable Medical Equipment, Home Health Choice offered to:  Patient  DME Arranged:  3-N-1 DME Agency:  [place]:  PT [place]:  [name]  Status of Service:  In process, will continue to follow  If discussed at Long Length of Stay Meetings, dates discussed:    Additional Comments:  Pollie Friar, RN 11/03/2015, 10:57 AM

## 2015-11-03 NOTE — Progress Notes (Signed)
   Subjective:  Patient reports pain as moderate.  Pain well controlled last night.  Objective:   VITALS:   Filed Vitals:   11/02/15 1700 11/02/15 2104 11/02/15 2350 11/03/15 0400  BP: 144/80 140/74 143/71 131/60  Pulse: 66 67 71 66  Temp: 97.4 F (36.3 C) 97.6 F (36.4 C) 97.3 F (36.3 C) 97.5 F (36.4 C)  TempSrc: Oral Oral Oral Oral  Resp: 17 16 18 19   Height:      Weight:      SpO2: 100% 95% 96% 96%    Neurologically intact Neurovascular intact Sensation intact distally Intact pulses distally Dorsiflexion/Plantar flexion intact Incision: dressing C/D/I and no drainage No cellulitis present Compartment soft   Lab Results  Component Value Date   WBC 13.7* 11/03/2015   HGB 12.8 11/03/2015   HCT 37.2 11/03/2015   MCV 84.9 11/03/2015   PLT 203 11/03/2015     Assessment/Plan:  1 Day Post-Op   - Expected postop acute blood loss anemia - will monitor for symptoms - Up with PT/OT - DVT ppx - SCDs, ambulation, xarelto - WBAT operative extremity - Pain controlled with toradol, oxycontin, oxycodone, robaxin - Discharge planning - anticipate dc home sat with HHPT - please set up HHPT with Liam Graham 11/03/2015, 8:02 AM [phone]

## 2015-11-03 NOTE — Discharge Instructions (Signed)
INSTRUCTIONS AFTER JOINT REPLACEMENT   o Remove items at home which could result in a fall. This includes throw rugs or furniture in walking pathways o ICE to the affected joint every three hours while awake for 30 minutes at a time, for at least the first 3-5 days, and then as needed for pain and swelling.  Continue to use ice for pain and swelling. You may notice swelling that will progress down to the foot and ankle.  This is normal after surgery.  Elevate your leg when you are not up walking on it.   o Continue to use the breathing machine you got in the hospital (incentive spirometer) which will help keep your temperature down.  It is common for your temperature to cycle up and down following surgery, especially at night when you are not up moving around and exerting yourself.  The breathing machine keeps your lungs expanded and your temperature down.   DIET:  As you were doing prior to hospitalization, we recommend a well-balanced diet.  DRESSING / WOUND CARE / SHOWERING  You may change your surgical dressing 7 days after surgery.  Then change the dressing every day with sterile gauze.  Please use good hand washing techniques before changing the dressing.  Do not use any lotions or creams on the incision until instructed by your surgeon.  You may shower while you have the surgical dressing which is waterproof.  After removal of surgical dressing, you must cover the incision when showering.  ACTIVITY  o Increase activity slowly as tolerated, but follow the weight bearing instructions below.   o No driving for 6 weeks or until further direction given by your physician.  You cannot drive while taking narcotics.  o No lifting or carrying greater than 10 lbs. until further directed by your surgeon. o Avoid periods of inactivity such as sitting longer than an hour when not asleep. This helps prevent blood clots.  o You may return to work once you are authorized by your doctor.     WEIGHT  BEARING   Weight bearing as tolerated with assist device (walker, cane, etc) as directed, use it as long as suggested by your surgeon or therapist, typically at least 4-6 weeks.   EXERCISES  Results after joint replacement surgery are often greatly improved when you follow the exercise, range of motion and muscle strengthening exercises prescribed by your doctor. Safety measures are also important to protect the joint from further injury. Any time any of these exercises cause you to have increased pain or swelling, decrease what you are doing until you are comfortable again and then slowly increase them. If you have problems or questions, call your caregiver or physical therapist for advice.   Rehabilitation is important following a joint replacement. After just a few days of immobilization, the muscles of the leg can become weakened and shrink (atrophy).  These exercises are designed to build up the tone and strength of the thigh and leg muscles and to improve motion. Often times heat used for twenty to thirty minutes before working out will loosen up your tissues and help with improving the range of motion but do not use heat for the first two weeks following surgery (sometimes heat can increase post-operative swelling).   These exercises can be done on a training (exercise) mat, on the floor, on a table or on a bed. Use whatever works the best and is most comfortable for you.    Use music or television while  you are exercising so that the exercises are a pleasant break in your day. This will make your life better with the exercises acting as a break in your routine that you can look forward to.   Perform all exercises about fifteen times, three times per day or as directed.  You should exercise both the operative leg and the other leg as well. ° °Exercises include: °  °• Quad Sets - Tighten up the muscle on the front of the thigh (Quad) and hold for 5-10 seconds.   °• Straight Leg Raises - With your  knee straight (if you were given a brace, keep it on), lift the leg to 60 degrees, hold for 3 seconds, and slowly lower the leg.  Perform this exercise against resistance later as your leg gets stronger.  °• Leg Slides: Lying on your back, slowly slide your foot toward your buttocks, bending your knee up off the floor (only go as far as is comfortable). Then slowly slide your foot back down until your leg is flat on the floor again.  °• Angel Wings: Lying on your back spread your legs to the side as far apart as you can without causing discomfort.  °• Hamstring Strength:  Lying on your back, push your heel against the floor with your leg straight by tightening up the muscles of your buttocks.  Repeat, but this time bend your knee to a comfortable angle, and push your heel against the floor.  You may put a pillow under the heel to make it more comfortable if necessary.  ° °A rehabilitation program following joint replacement surgery can speed recovery and prevent re-injury in the future due to weakened muscles. Contact your doctor or a physical therapist for more information on knee rehabilitation.  ° ° °CONSTIPATION ° °Constipation is defined medically as fewer than three stools per week and severe constipation as less than one stool per week.  Even if you have a regular bowel pattern at home, your normal regimen is likely to be disrupted due to multiple reasons following surgery.  Combination of anesthesia, postoperative narcotics, change in appetite and fluid intake all can affect your bowels.  ° °YOU MUST use at least one of the following options; they are listed in order of increasing strength to get the job done.  They are all available over the counter, and you may need to use some, POSSIBLY even all of these options:   ° °Drink plenty of fluids (prune juice may be helpful) and high fiber foods °Colace 100 mg by mouth twice a day  °Senokot for constipation as directed and as needed Dulcolax (bisacodyl), take  with full glass of water  °Miralax (polyethylene glycol) once or twice a day as needed. ° °If you have tried all these things and are unable to have a bowel movement in the first 3-4 days after surgery call either your surgeon or your primary doctor.   ° °If you experience loose stools or diarrhea, hold the medications until you stool forms back up.  If your symptoms do not get better within 1 week or if they get worse, check with your doctor.  If you experience "the worst abdominal pain ever" or develop nausea or vomiting, please contact the office immediately for further recommendations for treatment. ° ° °ITCHING:  If you experience itching with your medications, try taking only a single pain pill, or even half a pain pill at a time.  You can also use Benadryl over the counter   for itching or also to help with sleep.   TED HOSE STOCKINGS:  Use stockings on both legs until for at least 2 weeks or as directed by physician office. They may be removed at night for sleeping.  MEDICATIONS:  See your medication summary on the After Visit Summary that nursing will review with you.  You may have some home medications which will be placed on hold until you complete the course of blood thinner medication.  It is important for you to complete the blood thinner medication as prescribed.  PRECAUTIONS:  If you experience chest pain or shortness of breath - call 911 immediately for transfer to the hospital emergency department.   If you develop a fever greater that 101 F, purulent drainage from wound, increased redness or drainage from wound, foul odor from the wound/dressing, or calf pain - CONTACT YOUR SURGEON.                                                   FOLLOW-UP APPOINTMENTS:  If you do not already have a post-op appointment, please call the office for an appointment to be seen by your surgeon.  Guidelines for how soon to be seen are listed in your After Visit Summary, but are typically between 1-4 weeks  after surgery.  OTHER INSTRUCTIONS:   Knee Replacement:  Do not place pillow under knee, focus on keeping the knee straight while resting. CPM instructions: 0-90 degrees, 2 hours in the morning, 2 hours in the afternoon, and 2 hours in the evening. Place foam block, curve side up under heel at all times except when in CPM or when walking.  DO NOT modify, tear, cut, or change the foam block in any way.  MAKE SURE YOU:   Understand these instructions.   Get help right away if you are not doing well or get worse.    Thank you for letting us be a part of your medical care team.  It is a privilege we respect greatly.  We hope these instructions will help you stay on track for a fast and full recovery!    Information on my medicine - XARELTO (Rivaroxaban)  This medication education was reviewed with me or my healthcare representative as part of my discharge preparation.  The pharmacist that spoke with me during my hospital stay was:  Pat Patrick, [facility]  Why was Xarelto prescribed for you? Xarelto was prescribed for you to reduce the risk of blood clots forming after orthopedic surgery. The medical term for these abnormal blood clots is venous thromboembolism (VTE).  What do you need to know about xarelto ? Take your Xarelto ONCE DAILY at the same time every day. You may take it either with or without food.  If you have difficulty swallowing the tablet whole, you may crush it and mix in applesauce just prior to taking your dose.  Take Xarelto exactly as prescribed by your doctor and DO NOT stop taking Xarelto without talking to the doctor who prescribed the medication.  Stopping without other VTE prevention medication to take the place of Xarelto may increase your risk of developing a clot.  After discharge, you should have regular check-up appointments with your healthcare provider that is prescribing your Xarelto.    What do you do if you miss a dose? If you miss a dose,  take it as soon as you remember on the same day then continue your regularly scheduled once daily regimen the next day. Do not take two doses of Xarelto on the same day.   Important Safety Information A possible side effect of Xarelto is bleeding. You should call your healthcare provider right away if you experience any of the following: ? Bleeding from an injury or your nose that does not stop. ? Unusual colored urine (red or dark brown) or unusual colored stools (red or black). ? Unusual bruising for unknown reasons. ? A serious fall or if you hit your head (even if there is no bleeding).  Some medicines may interact with Xarelto and might increase your risk of bleeding while on Xarelto. To help avoid this, consult your healthcare provider or pharmacist prior to using any new prescription or non-prescription medications, including herbals, vitamins, non-steroidal anti-inflammatory drugs (NSAIDs) and supplements.  This website has more information on Xarelto: [email or web address]

## 2015-11-03 NOTE — Evaluation (Signed)
Occupational Therapy Evaluation Patient Details Name: Susan Delgado MRN: [id number] DOB: [birth year] Today's Date: 11/03/2015    History of Present Illness 60 y.o. female now s/p Lt TKA and Rt elbow steroid injection. PMH: hypertension, anxiety, Lt rotator cuff repair 12/16.    Clinical Impression   Pt reports she was independent with ADLs and mobility PTA. Currently pt overall supervision for safety with ADLs and functional mobility. All safety and ADL education completed with pt and husband. Pt planning to d/c home with 24/7 supervision from family. No further acute OT needs identified; signing off at this time. Please re-consult if needs change. Thank you for this referral.    Follow Up Recommendations  No OT follow up;Supervision/Assistance - 24 hour    Equipment Recommendations  3 in 1 bedside comode    Recommendations for Other Services       Precautions / Restrictions Precautions Precautions: Knee;Fall Precaution Booklet Issued: Yes (comment) Precaution Comments: Reviewed knee extension precatuions with pt and husband. Restrictions Weight Bearing Restrictions: Yes LLE Weight Bearing: Weight bearing as tolerated      Mobility Bed Mobility Overal bed mobility: Needs Assistance Bed Mobility: Supine to Sit;Sit to Supine     Supine to sit: Supervision Sit to supine: Supervision   General bed mobility comments: Supervision for safety; no physical assist required. Pt able to hook R leg under L to assist with coming to EOB. Pt able to scoot self up in bed independently.  Transfers Overall transfer level: Needs assistance Equipment used: Rolling walker (2 wheeled) Transfers: Sit to/from Stand Sit to Stand: Supervision         General transfer comment: Supervision for safety. Pt reports slight dizziness and nausea upon sitting up on EOB but resolved with a few minutes.    Balance Overall balance assessment: Needs assistance Sitting-balance support: No upper  extremity supported;Feet supported Sitting balance-Leahy Scale: Good     Standing balance support: Bilateral upper extremity supported;During functional activity Standing balance-Leahy Scale: Fair Standing balance comment: using rw                            ADL Overall ADL's : Needs assistance/impaired Eating/Feeding: Independent;Sitting   Grooming: Supervision/safety;Standing   Upper Body Bathing: Supervision/ safety;Set up;Sitting   Lower Body Bathing: Set up;Supervison/ safety;Sit to/from stand   Upper Body Dressing : Set up;Supervision/safety;Sitting   Lower Body Dressing: Set up;Supervision/safety;Sit to/from stand Lower Body Dressing Details (indicate cue type and reason): Pt able to reach feet. Educated pt on L leg first into clothing. Toilet Transfer: Supervision/safety;Ambulation;BSC;RW Toilet Transfer Details (indicate cue type and reason): Simulated by sit to stand from [place] and Hygiene: Supervision/safety;Sit to/from stand   Tub/ Shower Transfer: Supervision/safety;Walk-in shower;Ambulation;Shower Technical sales engineer Details (indicate cue type and reason): Educated pt on walk in shower transfer technique; pt able to return demo simulation with supervision for safety. Educated pt and husband for supervision upon return home for safety. Functional mobility during ADLs: Supervision/safety;Rolling walker General ADL Comments: Educated pt on home safety, use of zero degree bone foam, ice for edema and pain.     Vision Vision Assessment?: No apparent visual deficits   Perception     Praxis      Pertinent Vitals/Pain Pain Assessment: Faces Pain Score: 2  Faces Pain Scale: Hurts little more Pain Location: L knee Pain Descriptors / Indicators: Sore Pain Intervention(s): Monitored during session;Repositioned;Ice applied     Hand Dominance  Extremity/Trunk Assessment Upper Extremity  Assessment Upper Extremity Assessment: Overall WFL for tasks assessed;RUE deficits/detail;LUE deficits/detail RUE Deficits / Details: received injection into Rt elbow during surgery (biceps region) LUE Deficits / Details: 12/16 Rotator cuff repair.    Lower Extremity Assessment Lower Extremity Assessment: Defer to PT evaluation LLE Deficits / Details: poor quad activation   Cervical / Trunk Assessment Cervical / Trunk Assessment: Normal   Communication Communication Communication: No difficulties   Cognition Arousal/Alertness: Awake/alert Behavior During Therapy: WFL for tasks assessed/performed Overall Cognitive Status: Within Functional Limits for tasks assessed                     General Comments       Exercises Exercises: Total Joint     Shoulder Instructions      Home Living Family/patient expects to be discharged to:: Private residence Living Arrangements: Spouse/significant other Available Help at Discharge: Family;Available 24 hours/day Type of Home: House Home Access: Stairs to enter [employer] of Steps: 1 Entrance Stairs-Rails: None Home Layout: Two level;Able to live on main level with bedroom/bathroom     Bathroom Shower/Tub: Occupational psychologist: Handicapped height     Home Equipment: [place] - single point;Walker - 4 wheels;Shower seat - built in          Prior Functioning/Environment Level of Independence: Independent        Comments: Very active; enjoys playing tennis, gardening, riding horses    OT Diagnosis: Acute pain   OT Problem List:     OT Treatment/Interventions:      OT Goals(Current goals can be found in the care plan section) Acute Rehab OT Goals Patient Stated Goal: get back to being active OT Goal Formulation: All assessment and education complete, DC therapy  OT Frequency:     Barriers to D/C:            Co-evaluation              End of Session Equipment Utilized During  Treatment: Rolling walker  Activity Tolerance: Patient tolerated treatment well Patient left: in bed;with call bell/phone within reach;with family/visitor present   Time: 1210-1233 OT Time Calculation (min): 23 min Charges:  OT General Charges $OT Visit: 1 Procedure OT Evaluation $OT Eval Moderate Complexity: 1 Procedure OT Treatments $Self Care/Home Management : 8-22 mins G-Codes:     Binnie Kand M.S., OTR/L Pager: [phone]  11/03/2015, 1:21 PM

## 2015-11-03 NOTE — Progress Notes (Signed)
Physical Therapy Treatment Patient Details Name: Susan Delgado MRN: [id number] DOB: [birth year] Today's Date: 11/03/2015    History of Present Illness 60 y.o. female now s/p Lt TKA and Rt elbow steroid injection. PMH: hypertension, anxiety, Lt rotator cuff repair 12/16.     PT Comments    Pt making steady progress with PT regarding mobility and safety. Anticipate pt will D/C to home with family support following acute stay. Recommending HHPT services to follow. Will continue to follow and progress as tolerated.   Follow Up Recommendations  Home health PT;Supervision for mobility/OOB     Equipment Recommendations  Rolling walker with 5" wheels    Recommendations for Other Services       Precautions / Restrictions Precautions Precautions: Knee;Fall Precaution Comments: reinforced knee extension Restrictions Weight Bearing Restrictions: Yes LLE Weight Bearing: Weight bearing as tolerated    Mobility  Bed Mobility Overal bed mobility: Needs Assistance Bed Mobility: Supine to Sit     Supine to sit: Supervision   General bed mobility comments: encouraging knee flexion as turning to sit EOB.   Transfers Overall transfer level: Needs assistance Equipment used: Rolling walker (2 wheeled) Transfers: Sit to/from Stand Sit to Stand: Supervision         General transfer comment: good technique, performed from bed and toilet height.   Ambulation/Gait Ambulation/Gait assistance: Min guard Ambulation Distance (Feet): 100 Feet Assistive device: Rolling walker (2 wheeled) Gait Pattern/deviations: Step-through pattern Gait velocity: decreased   General Gait Details: Poor knee flexion with swing phase, working on even strides and full step through pattern.    Stairs            Wheelchair Mobility    Modified Rankin (Stroke Patients Only)       Balance Overall balance assessment: Needs assistance Sitting-balance support: No upper extremity supported Sitting  balance-Leahy Scale: Good     Standing balance support: Bilateral upper extremity supported Standing balance-Leahy Scale: Poor Standing balance comment: using rw for support                    Cognition Arousal/Alertness: Awake/alert Behavior During Therapy: WFL for tasks assessed/performed Overall Cognitive Status: Within Functional Limits for tasks assessed                      Exercises Total Joint Exercises Ankle Circles/Pumps: AROM;Both;10 reps Quad Sets: Strengthening;Left;10 reps Short Arc Quad: Strengthening;Left;10 reps (min assist/supervision) Heel Slides: AAROM;Left;10 reps Straight Leg Raises: Strengthening;Left;10 reps (min assist X6, supervision X4) Goniometric ROM: 90 degrees flexion with assist    General Comments        Pertinent Vitals/Pain Pain Assessment: 0-10 Pain Score: 2  Faces Pain Scale: Hurts little more Pain Location: Lt knee Pain Descriptors / Indicators: Sore Pain Intervention(s): Limited activity within patient's tolerance;Monitored during session;Ice applied    Home Living       Prior Function       PT Goals (current goals can now be found in the care plan section) Acute Rehab PT Goals Patient Stated Goal: get back home PT Goal Formulation: With patient Time For Goal Achievement: 11/17/15 Potential to Achieve Goals: Good Progress towards PT goals: Progressing toward goals    Frequency  7X/week    PT Plan Current plan remains appropriate    Co-evaluation             End of Session Equipment Utilized During Treatment: Gait belt Activity Tolerance: Patient tolerated treatment well Patient left: in chair;with call  bell/phone within reach;with family/visitor present (in knee extension stretch)     Time: [id number] PT Time Calculation (min) (ACUTE ONLY): 36 min  Charges:  $Gait Training: 8-22 mins $Therapeutic Exercise: 8-22 mins                    G Codes:      Cassell Clement, PT, CSCS Pager [phone] Office [phone]  11/03/2015, 3:52 PM

## 2015-11-04 MED ORDER — METAXALONE 400 MG HALF TABLET
400.0000 mg | ORAL_TABLET | Freq: Four times a day (QID) | ORAL | Status: DC | PRN
  Filled 2015-11-04: qty 1

## 2015-11-04 NOTE — Progress Notes (Signed)
Subjective: 2 Days Post-Op Procedure(s) (LRB): LEFT TOTAL KNEE ARTHROPLASTY RIGHT ELBOW INJECTION  (Left)  RIGHT ELBOW INJECTION  (Right) Patient reports pain as moderate.    Objective: Vital signs in last 24 hours: Temp:  [97.3 F (36.3 C)-98.6 F (37 C)] 98.5 F (36.9 C) (07/15 0551) Pulse Rate:  [69-81] 69 (07/15 0551) Resp:  [18-20] 18 (07/15 0551) BP: (133-149)/(60-81) 133/60 mmHg (07/15 0551) SpO2:  [98 %-100 %] 99 % (07/15 0551)  Intake/Output from previous day: 07/14 0701 - 07/15 0700 In: 100 [IV Piggyback:100] Out: 1  Intake/Output this shift:     Recent Labs  11/03/15 0315  HGB 12.8    Recent Labs  11/03/15 0315  WBC 13.7*  RBC 4.38  HCT 37.2  PLT 203    Recent Labs  11/03/15 0315  NA 133*  K 4.1  CL 102  CO2 24  BUN 15  CREATININE 1.06*  GLUCOSE 142*  CALCIUM 8.6*   No results for input(s): LABPT, INR in the last 72 hours.  Neurologically intact  Assessment/Plan: 2 Days Post-Op Procedure(s) (LRB): LEFT TOTAL KNEE ARTHROPLASTY RIGHT ELBOW INJECTION  (Left)  RIGHT ELBOW INJECTION  (Right) Discharge home with home health  Susan Delgado C 11/04/2015, 9:30 AM

## 2015-11-04 NOTE — Progress Notes (Signed)
Physical Therapy Treatment Patient Details Name: Susan Delgado MRN: [id number] DOB: 10-14-1955 Today's Date: 11/04/2015    History of Present Illness 60 y.o. female now s/p Lt TKA and Rt elbow steroid injection. PMH: hypertension, anxiety, Lt rotator cuff repair 12/16.     PT Comments    Pt is demonstrating good comprehension of stair climbing with PT, instructed her husband as well to assist her.  Pt is scheduled to leave and will have HHPT follow up as needed, then likely to outpatient to finish therapy.  Follow Up Recommendations  Home health PT;Supervision for mobility/OOB     Equipment Recommendations  Rolling walker with 5" wheels    Recommendations for Other Services Rehab consult     Precautions / Restrictions Precautions Precautions: Knee;Fall Precaution Booklet Issued: Yes (comment) Precaution Comments: reinforced knee extension Restrictions Weight Bearing Restrictions: Yes LLE Weight Bearing: Weight bearing as tolerated    Mobility  Bed Mobility Overal bed mobility: Needs Assistance Bed Mobility: Supine to Sit     Supine to sit: Supervision Sit to supine: Supervision      Transfers Overall transfer level: Needs assistance Equipment used: Rolling walker (2 wheeled) Transfers: Sit to/from [facility] Sit to Stand: Supervision Stand pivot transfers: Min guard;Supervision       General transfer comment: pt is using hands to control descent to sit  Ambulation/Gait Ambulation/Gait assistance: Supervision;Min guard Ambulation Distance (Feet): 150 Feet Assistive device: Rolling walker (2 wheeled) Gait Pattern/deviations: Decreased weight shift to left;Decreased stride length;Step-through pattern;Step-to pattern;Wide base of support Gait velocity: decreased Gait velocity interpretation: Below normal speed for age/gender General Gait Details: Pt is on her toes despite cues to flatten foot and strike heel   Stairs Stairs: Yes Stairs  assistance: Min guard Stair Management: One rail Right;Forwards;Step to pattern Number of Stairs: 3 General stair comments: worked to handle up and down wth R knee hurting but has OA as well  Wheelchair Mobility    Modified Rankin (Stroke Patients Only)       Balance     Sitting balance-Leahy Scale: Good       Standing balance-Leahy Scale: Fair Standing balance comment: on RW to steady                    Cognition Arousal/Alertness: Awake/alert Behavior During Therapy: WFL for tasks assessed/performed Overall Cognitive Status: Within Functional Limits for tasks assessed                      Exercises      General Comments        Pertinent Vitals/Pain Pain Assessment: 0-10 Pain Score: 5  Pain Location: L knee, R knee on stairs Pain Descriptors / Indicators: Sore Pain Intervention(s): Monitored during session;Limited activity within patient's tolerance;Premedicated before session;Repositioned    Home Living                      Prior Function            PT Goals (current goals can now be found in the care plan section) Acute Rehab PT Goals Patient Stated Goal: get home Progress towards PT goals: Progressing toward goals    Frequency  7X/week    PT Plan Current plan remains appropriate    Co-evaluation             End of Session Equipment Utilized During Treatment: Gait belt Activity Tolerance: Patient tolerated treatment well Patient left: with call bell/phone within reach;in  bed;with family/visitor present;with nursing/sitter in room     Time: [id number] PT Time Calculation (min) (ACUTE ONLY): 26 min  Charges:  $Gait Training: 8-22 mins $Therapeutic Activity: 8-22 mins                    G Codes:      Ramond Dial 23-Nov-2015, 1:24 PM    Mee Hives, PT MS Acute Rehab Dept. Number: [place] [facility] [place]

## 2015-11-04 NOTE — Progress Notes (Signed)
CM received call from RN requesting a Rw. CM called [facility], to please deliver RW so pt can discharge.  No other CM neds were communicated.

## 2016-02-15 ENCOUNTER — Encounter (INDEPENDENT_AMBULATORY_CARE_PROVIDER_SITE_OTHER): Payer: Self-pay | Admitting: Orthopaedic Surgery

## 2016-02-15 ENCOUNTER — Ambulatory Visit (INDEPENDENT_AMBULATORY_CARE_PROVIDER_SITE_OTHER): Payer: Self-pay

## 2016-02-15 ENCOUNTER — Ambulatory Visit (INDEPENDENT_AMBULATORY_CARE_PROVIDER_SITE_OTHER): Payer: BLUE CROSS/BLUE SHIELD | Admitting: Orthopaedic Surgery

## 2016-02-15 DIAGNOSIS — Z96652 Presence of left artificial knee joint: Secondary | ICD-10-CM

## 2016-02-15 DIAGNOSIS — M25562 Pain in left knee: Secondary | ICD-10-CM

## 2016-02-15 MED ORDER — BUPIVACAINE HCL 0.5 % IJ SOLN
2.0000 mL | INTRAMUSCULAR | Status: AC | PRN
  Administered 2016-02-15: 2 mL via INTRA_ARTICULAR

## 2016-02-15 MED ORDER — METHYLPREDNISOLONE ACETATE 40 MG/ML IJ SUSP
40.0000 mg | INTRAMUSCULAR | Status: AC | PRN
  Administered 2016-02-15: 40 mg via INTRA_ARTICULAR

## 2016-02-15 MED ORDER — LIDOCAINE HCL 1 % IJ SOLN
2.0000 mL | INTRAMUSCULAR | Status: AC | PRN
  Administered 2016-02-15: 2 mL

## 2016-02-15 MED ORDER — DICLOFENAC SODIUM 1 % TD GEL: 2.0000 g | Freq: Four times a day (QID) | TRANSDERMAL | Status: AC

## 2016-02-15 NOTE — Progress Notes (Signed)
Office Visit Note   Patient: Susan Delgado           Date of Birth: 07/03/1955           MRN: [id number] Visit Date: 02/15/2016              Requested by: Eliott Nine. Lischke, MD No address on file PCP: Shary Key, MD   Assessment & Plan: Visit Diagnoses:  1. Acute pain of left knee   2. Status post total left knee replacement     Plan:  - pes bursitis right knee - injection given today and voltaren gel  - left knee progressing well, ROM is improving to 115 - f/u 3 months, no xrays needed   Follow-Up Instructions: Return in about 3 months (around 05/17/2016) for recheck knee.   Orders:  Orders Placed This Encounter  Procedures  . Large Joint Injection/Arthrocentesis  . XR Knee 1-2 Views Left   Meds ordered this encounter  Medications  . diclofenac sodium (VOLTAREN) 1 % transdermal gel 2 g      Procedures: Large Joint Inj Date/Time: 02/15/2016 11:45 AM Performed by: Leandrew Koyanagi Authorized by: Leandrew Koyanagi   Consent Given by:  Patient Timeout: prior to procedure the correct patient, procedure, and site was verified   Indications:  Pain Location:  Knee Site:  R knee Prep: patient was prepped and draped in usual sterile fashion   Needle Size:  22 G Approach:  Medial Ultrasound Guidance: No   Fluoroscopic Guidance: No   Arthrogram: No Medications:  2 mL lidocaine 1 %; 2 mL bupivacaine 0.5 %; 40 mg methylPREDNISolone acetate 40 MG/ML     Clinical Data: No additional findings.   Subjective: Chief Complaint  Patient presents with  . Left Knee - Follow-up, Routine Post Op  . Right Elbow - Follow-up, Routine Post Op  . Left Hand - Injury, Wound Check    3 month postop visit for L TKA progressing well with PT.  Off narcotic pain meds for 2 weeks now.  Overall improving.  Happy with progress    Review of Systems   Objective: Vital Signs: There were no vitals taken for this visit.  Physical Exam  Left Knee Exam   Tenderness  The patient is  experiencing tenderness in the pes anserinus.  Comments:  Slight valgus/varus jog with firm endpoint.  ROM 0-115.  Well healed scar.  Pes ttp.       Specialty Comments:  No specialty comments available.  Imaging: No results found.   PMFS History: Patient Active Problem List   Diagnosis Date Noted  . Osteoarthritis of left knee 11/02/2015  . Total knee replacement status 11/02/2015   Past Medical History:  Diagnosis Date  . Anxiety   . Arthritis    "pretty much all over" (11/02/2015)  . Basal cell carcinoma of right shoulder    "burned off"  . Chronic stomach ulcer   . Complication of anesthesia   . Dysrhythmia    PVC's  . Environmental allergies   . Family history of adverse reaction to anesthesia    daughter has difficulty waking up   . GERD (gastroesophageal reflux disease)   . High cholesterol   . History of hiatal hernia   . Hypertension   . PONV (postoperative nausea and vomiting)    needs patch  . Squamous cell carcinoma of scalp    "MOHS"  . Vitamin B12 deficiency    on monthly injections    Family History  Problem Relation Age of Onset  . Stroke Mother   . Arthritis Mother   . Heart disease Mother   . Lung cancer Father     Past Surgical History:  Procedure Laterality Date  . ABDOMINAL HYSTERECTOMY  2004  . CARPAL TUNNEL RELEASE Right 2014   Dr. Hal Neer  . COLONOSCOPY    . JOINT REPLACEMENT    . MOHS SURGERY  2015   scalp  . PLANTAR FASCIA RELEASE Right 2012  . SHOULDER ARTHROSCOPY W/ ROTATOR CUFF REPAIR Left 03/2015  . STERIOD INJECTION Right 11/02/2015   Procedure:  RIGHT ELBOW INJECTION ;  Surgeon: Leandrew Koyanagi, MD;  Location: [facility];  Service: Orthopedics;  Laterality: Right;  . TOTAL KNEE ARTHROPLASTY Right 11/02/2015  . TOTAL KNEE ARTHROPLASTY Left 11/02/2015   Procedure: LEFT TOTAL KNEE ARTHROPLASTY RIGHT ELBOW INJECTION ;  Surgeon: Leandrew Koyanagi, MD;  Location: [facility];  Service: Orthopedics;  Laterality: Left;   Social History    Occupational History  . Not on file.   Social History Main Topics  . Smoking status: Never Smoker  . Smokeless tobacco: Never Used  . Alcohol use No  . Drug use: No  . Sexual activity: Yes

## 2016-02-17 ENCOUNTER — Other Ambulatory Visit (INDEPENDENT_AMBULATORY_CARE_PROVIDER_SITE_OTHER): Payer: Self-pay | Admitting: Orthopaedic Surgery

## 2016-02-26 NOTE — Telephone Encounter (Signed)
[facility] called about Voltaren gel 100 g. Need this called in. Call back number [phone]

## 2016-02-26 NOTE — Telephone Encounter (Signed)
Patient requesting RX refill on Celebrex 200 mg bid.  Pharmacy: [facility]: [phone]

## 2016-02-27 NOTE — Telephone Encounter (Signed)
See message below °

## 2016-02-27 NOTE — Telephone Encounter (Signed)
duplicate

## 2016-03-19 ENCOUNTER — Telehealth (INDEPENDENT_AMBULATORY_CARE_PROVIDER_SITE_OTHER): Payer: Self-pay | Admitting: Orthopaedic Surgery

## 2016-03-20 MED ORDER — DICLOFENAC SODIUM 1 % TD GEL: 2.0000 g | Freq: Four times a day (QID) | TRANSDERMAL | 0 refills | Status: AC

## 2016-03-20 NOTE — Telephone Encounter (Signed)
yes

## 2016-03-20 NOTE — Telephone Encounter (Signed)
We have not received any form.   Okay to refill Voltaren gel?

## 2016-03-20 NOTE — Telephone Encounter (Signed)
Faxed Rx to pharm

## 2016-04-09 ENCOUNTER — Ambulatory Visit (INDEPENDENT_AMBULATORY_CARE_PROVIDER_SITE_OTHER): Payer: BLUE CROSS/BLUE SHIELD | Admitting: Orthopaedic Surgery

## 2016-07-20 IMAGING — CR DG KNEE 1-2V PORT*L*
2 series · 2 of 2 positions shown · non-contrast
Comparison: Left knee series 1991.

CLINICAL DATA: 60-year-old female status post total knee
replacement. Initial encounter.

EXAM:
PORTABLE LEFT KNEE - 1-2 VIEW

[AP]
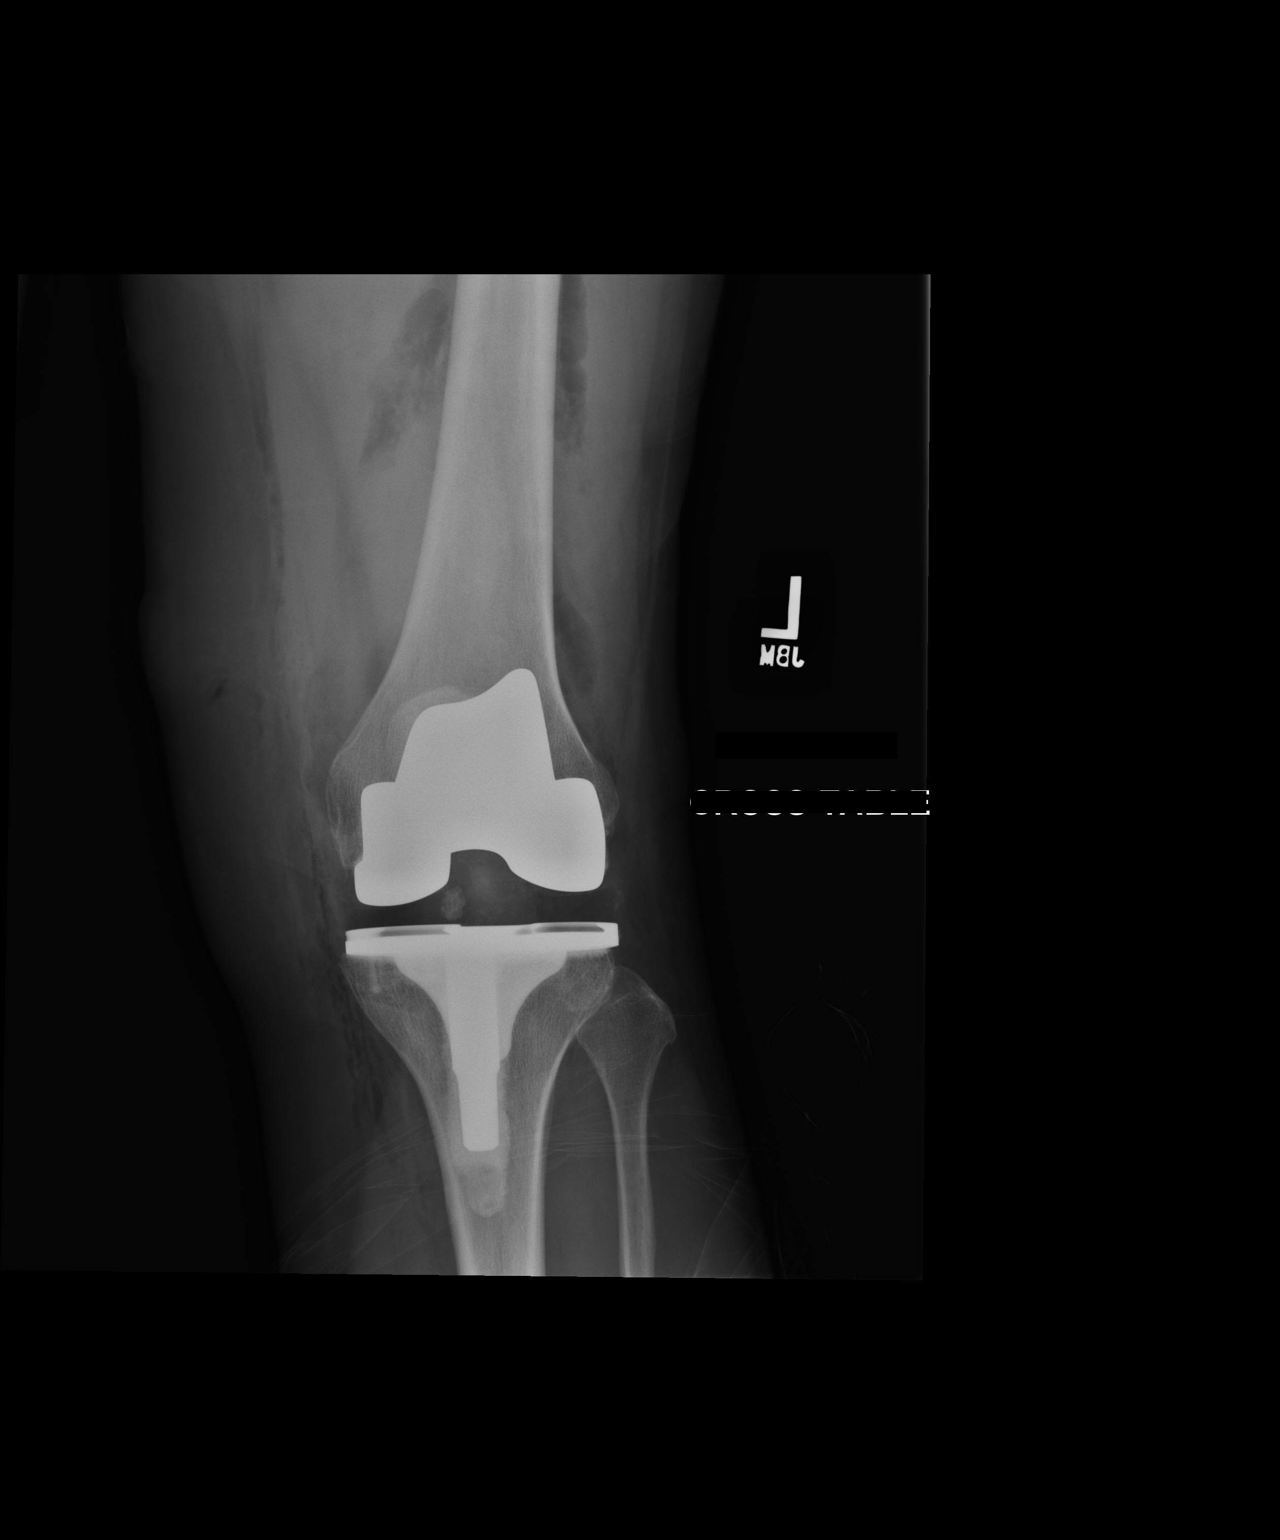

[xtable lateral]
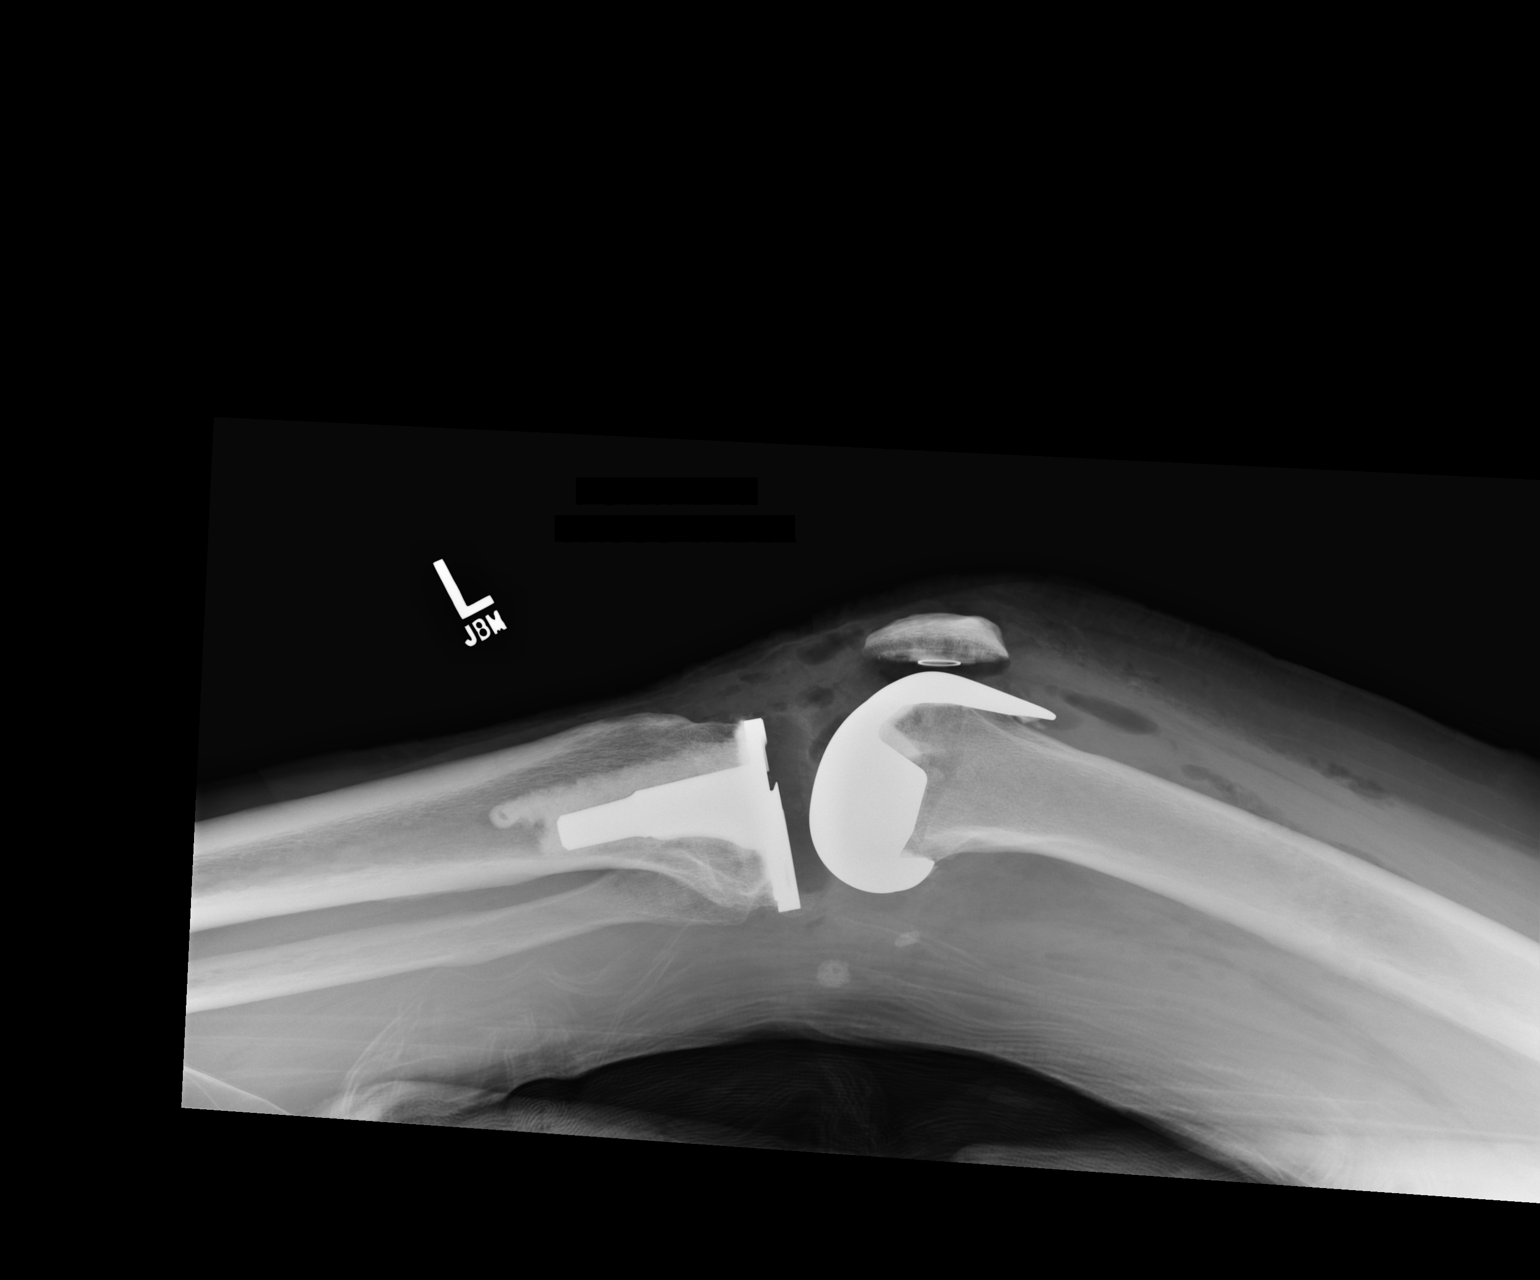

[2 of 2 positions shown; findings below may reference images not displayed]

FINDINGS: Portable cross-table lateral and AP views of the left knee at 0443
hours. Sequelae of left total knee arthroplasty. Hardware appears
intact and normally aligned. Postoperative changes to the
surrounding soft tissues including subcutaneous gas. Small ossific
bodies in the popliteal space are stable and might be vascular or
degenerative in nature. No unexpected osseous changes identified.
IMPRESSION: Left total knee arthroplasty with no adverse features.

## 2018-09-29 ENCOUNTER — Telehealth: Payer: Self-pay

## 2018-09-29 NOTE — Telephone Encounter (Signed)
See message below. We have not seen patient since 2017.

## 2018-09-29 NOTE — Telephone Encounter (Signed)
Mariah with [facility] would like a Pre-Dental Antibiotic note faxed to [phone] for patient's record.  Cb# is [phone].  Please advise.  Thank you.

## 2018-09-29 NOTE — Telephone Encounter (Signed)
What kind of dental work is she having done?

## 2018-09-29 NOTE — Telephone Encounter (Signed)
If it's just a routine cleaning, then she no longer needs prophylaxis.  If it's something more involved then she needs 2 g amoxicillin

## 2018-09-30 MED ORDER — AMOXICILLIN 500 MG PO TABS: ORAL_TABLET | ORAL | 0 refills | Status: AC

## 2018-09-30 NOTE — Telephone Encounter (Signed)
Received return call from [facility]. She states that they have rescheduled patient's appt today for next week. I sent in amoxicillin to pharmacy for patient. I also faxed note to dental office advising no pre med for routine cleaning and Amoxicillin 2g po 30 min prior to dental procedure if anything more extensive.

## 2018-09-30 NOTE — Addendum Note (Signed)
Addended by: Meyer Cory on: 09/30/2018 12:00 PM   Modules accepted: Orders

## 2018-09-30 NOTE — Telephone Encounter (Signed)
I called Susan Delgado to see what type of dental work patient is getting done. She states patient is scheduled for extraction and small filling today at 11:30am. I advised patient would need 2g amoxicillin. She is going to speak with the dentist and call me back.

## 2022-10-28 ENCOUNTER — Telehealth: Payer: Self-pay

## 2022-10-28 NOTE — Telephone Encounter (Signed)
-----   Message from Benancio Deeds, MD sent at 10/28/2022 12:49 PM EDT ----- Regarding: appointment This is the patient we discussed.  Can you offer her either APP appointment this week, or if she wants to see me I can do 7/16 at 430 PM? For abdominal pain, poor PO intake  Her cell is [phone].  Thank you! Can you let me know what she decides?

## 2022-10-28 NOTE — Telephone Encounter (Signed)
Thanks Jan for helping to coordinate this visit.  Bayley you can staff this case with me. I will be out of the office this week but feel free to contact me if needed, and send the note to me.  She may need an upper endoscopy based on what I have heard about her symptoms. I have an opening on 7/15 in the [facility], can use this slot for her if she needs it. Thanks

## 2022-10-28 NOTE — Telephone Encounter (Signed)
Patient has been scheduled for a NP appointment on Wed, 7-10 with PA, Bayley McMichael.  Records are in CareEverywhere.

## 2022-10-29 NOTE — Progress Notes (Deleted)
Chief Complaint: Primary GI MD: Dr. 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  This was discovered incidentally during appendectomy for appendicitis.  Primary pathology was T3N0, unchanged after ileocolectomy.  No residual tumor.  0 out of 15 lymph nodes  CT abdomen pelvis with contrast 10/18/2022 for abdominal pain showed infrarenal abdominal aortic aneurysm 4.1 cm, unchanged.  Normal gallbladder.  CT chest without contrast ruled out metastatic disease in the chest    PREVIOUS GI WORKUP   EGD/colonoscopy 02/19/2019 with [facility]-unable to see records. EGD was done for dysphagia, GERD, epigastric pain, and bloating.  12/2006 screening colonoscopy with [facility]: Internal hemorrhoids, otherwise normal  Past Medical History:  Diagnosis Date   Anxiety    Arthritis    "pretty much all over" (11/02/2015)   Basal cell carcinoma of right shoulder    "burned off"   Chronic stomach ulcer    Complication of anesthesia    Dysrhythmia    PVC's   Environmental allergies    Family history of adverse reaction to anesthesia    daughter has difficulty waking up    GERD (gastroesophageal reflux disease)    High cholesterol    History of hiatal hernia    Hypertension    PONV (postoperative nausea and vomiting)    needs patch   Squamous cell carcinoma of scalp    "MOHS"   Vitamin B12 deficiency    on monthly injections    Past Surgical History:  Procedure Laterality Date   ABDOMINAL HYSTERECTOMY  2004   CARPAL TUNNEL RELEASE Right 2014   Dr. Gerlene Fee   COLONOSCOPY     JOINT REPLACEMENT     MOHS SURGERY  2015   scalp   PLANTAR FASCIA RELEASE Right 2012   SHOULDER  ARTHROSCOPY W/ ROTATOR CUFF REPAIR Left 03/2015   STERIOD INJECTION Right 11/02/2015   Procedure:  RIGHT ELBOW INJECTION ;  Surgeon: Tarry Kos, MD;  Location: [facility] OR;  Service: Orthopedics;  Laterality: Right;   TOTAL KNEE ARTHROPLASTY Right 11/02/2015   TOTAL KNEE ARTHROPLASTY Left 11/02/2015   Procedure: LEFT TOTAL KNEE ARTHROPLASTY RIGHT ELBOW INJECTION ;  Surgeon: Tarry Kos, MD;  Location: [facility] OR;  Service: Orthopedics;  Laterality: Left;    Current Outpatient Medications  Medication Sig Dispense Refill   acetaminophen (TYLENOL) 650 MG CR tablet Take 650 mg by mouth 2 (two) times daily as needed for pain.     amoxicillin (AMOXIL) 500 MG tablet Take 2 grams (500mg  #4) by mouth 30 minutes prior to dental procedure. 4 tablet 0   celecoxib (CELEBREX) 200 MG capsule Take 1 capsule by mouth twice daily as needed for pain 60 capsule 3   cetirizine (ZYRTEC) 10 MG tablet Take 5 mg by mouth at bedtime.     dexlansoprazole (DEXILANT) 60 MG capsule Take 60 mg by mouth every morning.     diclofenac sodium (VOLTAREN) 1 % GEL Apply 2 g topically 4 (four) times daily. 100 g 0   estradiol (CLIMARA - DOSED IN MG/24 HR) 0.1 mg/24hr patch Place 0.1 mg onto the skin every Sunday.     fluticasone (FLONASE) 50 MCG/ACT nasal spray Place 2 sprays into both nostrils  daily.     ibuprofen (ADVIL,MOTRIN) 200 MG tablet Take 400 mg by mouth 2 (two) times daily as needed for moderate pain.     losartan (COZAAR) 100 MG tablet Take 100 mg by mouth daily.     methocarbamol (ROBAXIN) 750 MG tablet Take 1 tablet (750 mg total) by mouth 2 (two) times daily as needed for muscle spasms. (Patient not taking: Reported on 02/15/2016) 60 tablet 0   montelukast (SINGULAIR) 10 MG tablet Take 10 mg by mouth at bedtime.  3   Multiple Vitamins-Minerals (ALIVE WOMENS 50+ PO) Take 1 tablet by mouth daily. Chew 1 gummy daily.     naloxegol oxalate (MOVANTIK) 12.5 MG TABS tablet Take 1 tablet (12.5 mg total) by mouth daily. (Patient not  taking: Reported on 02/15/2016) 30 tablet 2   naproxen sodium (ANAPROX) 220 MG tablet Take 220 mg by mouth 2 (two) times daily as needed (for pain).     ondansetron (ZOFRAN) 4 MG tablet Take 1-2 tablets (4-8 mg total) by mouth every 8 (eight) hours as needed for nausea or vomiting. (Patient not taking: Reported on 02/15/2016) 40 tablet 0   oxyCODONE (OXY IR/ROXICODONE) 5 MG immediate release tablet Take 1-3 tablets (5-15 mg total) by mouth every 4 (four) hours as needed. (Patient not taking: Reported on 02/15/2016) 90 tablet 0   oxyCODONE (OXYCONTIN) 10 mg 12 hr tablet Take 1 tablet (10 mg total) by mouth every 12 (twelve) hours. (Patient not taking: Reported on 02/15/2016) 10 tablet 0   rivaroxaban (XARELTO) 10 MG TABS tablet Take 1 tablet (10 mg total) by mouth daily. (Patient not taking: Reported on 02/15/2016) 14 tablet 0   Current Facility-Administered Medications  Medication Dose Route Frequency Provider Last Rate Last Admin   diclofenac sodium (VOLTAREN) 1 % transdermal gel 2 g  2 g Topical QID Tarry Kos, MD        Allergies as of 10/30/2022 - Review Complete 02/15/2016  Allergen Reaction Noted   No known allergies  11/01/2015    Family History  Problem Relation Age of Onset   Stroke Mother    Arthritis Mother    Heart disease Mother    Lung cancer Father     Social History   Socioeconomic History   Marital status: Married    Spouse name: Not on file   Number of children: Not on file   Years of education: Not on file   Highest education level: Not on file  Occupational History   Not on file  Tobacco Use   Smoking status: Never   Smokeless tobacco: Never  Substance and Sexual Activity   Alcohol use: No   Drug use: No   Sexual activity: Yes  Other Topics Concern   Not on file  Social History Narrative   Not on file   Social Determinants of Health   Financial Resource Strain: Not on file  Food Insecurity: Not on file  Transportation Needs: Not on file   Physical Activity: Not on file  Stress: Not on file  Social Connections: Not on file  Intimate Partner Violence: Not on file    Review of Systems:    Constitutional: No weight loss, fever, chills, weakness or fatigue HEENT: Eyes: No change in vision               Ears, Nose, Throat:  No change in hearing or congestion Skin: No rash or itching Cardiovascular: No chest pain, chest pressure or palpitations   Respiratory: No SOB  or cough Gastrointestinal: See HPI and otherwise negative Genitourinary: No dysuria or change in urinary frequency Neurological: No headache, dizziness or syncope Musculoskeletal: No new muscle or joint pain Hematologic: No bleeding or bruising Psychiatric: No history of depression or anxiety    Physical Exam:  Vital signs: There were no vitals taken for this visit.  Constitutional: NAD, Well developed, Well nourished, alert and cooperative Head:  Normocephalic and atraumatic. Eyes:   PEERL, EOMI. No icterus. Conjunctiva pink. Respiratory: Respirations even and unlabored. Lungs clear to auscultation bilaterally.   No wheezes, crackles, or rhonchi.  Cardiovascular:  Regular rate and rhythm. No peripheral edema, cyanosis or pallor.  Gastrointestinal:  Soft, nondistended, nontender. No rebound or guarding. Normal bowel sounds. No appreciable masses or hepatomegaly. Rectal:  Not performed.  Msk:  Symmetrical without gross deformities. Without edema, no deformity or joint abnormality.  Neurologic:  Alert and  oriented x4;  grossly normal neurologically.  Skin:   Dry and intact without significant lesions or rashes. Psychiatric: Oriented to person, place and time. Demonstrates good judgement and reason without abnormal affect or behaviors.   RELEVANT LABS AND IMAGING: CBC    Component Value Date/Time   WBC 13.7 (H) 11/03/2015 0315   RBC 4.38 11/03/2015 0315   HGB 12.8 11/03/2015 0315   HCT 37.2 11/03/2015 0315   PLT 203 11/03/2015 0315   MCV 84.9  11/03/2015 0315   MCH 29.2 11/03/2015 0315   MCHC 34.4 11/03/2015 0315   RDW 13.1 11/03/2015 0315   LYMPHSABS 2.9 10/27/2015 1122   MONOABS 0.4 10/27/2015 1122   EOSABS 0.3 10/27/2015 1122   BASOSABS 0.0 10/27/2015 1122    CMP     Component Value Date/Time   NA 133 (L) 11/03/2015 0315   K 4.1 11/03/2015 0315   CL 102 11/03/2015 0315   CO2 24 11/03/2015 0315   GLUCOSE 142 (H) 11/03/2015 0315   BUN 15 11/03/2015 0315   CREATININE 1.06 (H) 11/03/2015 0315   CALCIUM 8.6 (L) 11/03/2015 0315   PROT 6.6 10/27/2015 1122   ALBUMIN 3.5 10/27/2015 1122   AST 20 10/27/2015 1122   ALT 18 10/27/2015 1122   ALKPHOS 58 10/27/2015 1122   BILITOT 0.5 10/27/2015 1122   GFRNONAA 56 (L) 11/03/2015 0315   GFRAA >60 11/03/2015 0315    Assessment: 1. ***  Plan: 1. ***     Jacqueline Spofford, PA-C [facility] 10/29/2022, 11:14 AM  Cc: Bing Matter., MD

## 2022-10-30 ENCOUNTER — Ambulatory Visit: Payer: Self-pay | Admitting: Gastroenterology
